# Patient Record
Sex: Female | Born: 1990 | Race: White | Hispanic: No | Marital: Single | Smoking: Never smoker
Health system: Southern US, Community
[De-identification: ages and names within clinical notes are randomized; demographics above are authoritative.]

## PROBLEM LIST (undated history)

## (undated) DIAGNOSIS — Q043 Other reduction deformities of brain: Secondary | ICD-10-CM

## (undated) DIAGNOSIS — G43909 Migraine, unspecified, not intractable, without status migrainosus: Secondary | ICD-10-CM

## (undated) DIAGNOSIS — R569 Unspecified convulsions: Secondary | ICD-10-CM

## (undated) HISTORY — PX: BREAST SURGERY: SHX581

## (undated) HISTORY — PX: FOOT SURGERY: SHX648

---
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
## 2008-03-21 ENCOUNTER — Other Ambulatory Visit: Admission: RE | Admit: 2008-03-21 | Discharge: 2008-03-21 | Payer: Self-pay | Admitting: Family Medicine

## 2009-10-03 ENCOUNTER — Other Ambulatory Visit: Admission: RE | Admit: 2009-10-03 | Discharge: 2009-10-03 | Payer: Self-pay | Admitting: Family Medicine

## 2009-11-10 ENCOUNTER — Emergency Department (HOSPITAL_BASED_OUTPATIENT_CLINIC_OR_DEPARTMENT_OTHER): Admission: EM | Admit: 2009-11-10 | Discharge: 2009-11-10 | Payer: Self-pay | Admitting: Emergency Medicine

## 2009-11-10 ENCOUNTER — Ambulatory Visit: Payer: Self-pay | Admitting: Diagnostic Radiology

## 2009-12-06 ENCOUNTER — Emergency Department (HOSPITAL_BASED_OUTPATIENT_CLINIC_OR_DEPARTMENT_OTHER): Admission: EM | Admit: 2009-12-06 | Discharge: 2009-12-06 | Payer: Self-pay | Admitting: Emergency Medicine

## 2010-09-06 ENCOUNTER — Emergency Department (HOSPITAL_BASED_OUTPATIENT_CLINIC_OR_DEPARTMENT_OTHER): Admission: EM | Admit: 2010-09-06 | Discharge: 2010-09-06 | Payer: Self-pay | Admitting: Emergency Medicine

## 2010-11-01 ENCOUNTER — Other Ambulatory Visit
Admission: RE | Admit: 2010-11-01 | Discharge: 2010-11-01 | Payer: Self-pay | Source: Home / Self Care | Admitting: Family Medicine

## 2010-11-29 ENCOUNTER — Emergency Department (HOSPITAL_BASED_OUTPATIENT_CLINIC_OR_DEPARTMENT_OTHER)
Admission: EM | Admit: 2010-11-29 | Discharge: 2010-11-30 | Disposition: A | Discharge status: Home / Self Care | Payer: Medicaid Other | Attending: Emergency Medicine | Admitting: Emergency Medicine

## 2010-11-29 DIAGNOSIS — R51 Headache: Secondary | ICD-10-CM | POA: Insufficient documentation

## 2011-04-01 ENCOUNTER — Emergency Department (HOSPITAL_BASED_OUTPATIENT_CLINIC_OR_DEPARTMENT_OTHER)
Admission: EM | Admit: 2011-04-01 | Discharge: 2011-04-01 | Disposition: A | Discharge status: Home / Self Care | Payer: Medicaid Other | Attending: Emergency Medicine | Admitting: Emergency Medicine

## 2011-04-01 DIAGNOSIS — G43909 Migraine, unspecified, not intractable, without status migrainosus: Secondary | ICD-10-CM | POA: Insufficient documentation

## 2011-04-01 DIAGNOSIS — Z79899 Other long term (current) drug therapy: Secondary | ICD-10-CM | POA: Insufficient documentation

## 2011-11-03 ENCOUNTER — Other Ambulatory Visit (HOSPITAL_COMMUNITY)
Admission: RE | Admit: 2011-11-03 | Discharge: 2011-11-03 | Disposition: A | Discharge status: Home / Self Care | Payer: Medicaid Other | Source: Ambulatory Visit | Attending: Family Medicine | Admitting: Family Medicine

## 2011-11-03 ENCOUNTER — Other Ambulatory Visit: Payer: Self-pay | Admitting: Family Medicine

## 2011-11-03 DIAGNOSIS — Z124 Encounter for screening for malignant neoplasm of cervix: Secondary | ICD-10-CM | POA: Insufficient documentation

## 2012-01-29 ENCOUNTER — Encounter (HOSPITAL_BASED_OUTPATIENT_CLINIC_OR_DEPARTMENT_OTHER): Payer: Self-pay | Admitting: *Deleted

## 2012-01-29 ENCOUNTER — Emergency Department (HOSPITAL_BASED_OUTPATIENT_CLINIC_OR_DEPARTMENT_OTHER)
Admission: EM | Admit: 2012-01-29 | Discharge: 2012-01-29 | Disposition: A | Discharge status: Home / Self Care | Payer: Medicaid Other | Attending: Emergency Medicine | Admitting: Emergency Medicine

## 2012-01-29 DIAGNOSIS — R51 Headache: Secondary | ICD-10-CM

## 2012-01-29 MED ORDER — METOCLOPRAMIDE HCL 5 MG/ML IJ SOLN
10.0000 mg | Freq: Once | INTRAMUSCULAR | Status: AC
  Administered 2012-01-29: 10 mg via INTRAVENOUS
  Filled 2012-01-29: qty 2

## 2012-01-29 MED ORDER — KETOROLAC TROMETHAMINE 30 MG/ML IJ SOLN
30.0000 mg | Freq: Once | INTRAMUSCULAR | Status: AC
  Administered 2012-01-29: 30 mg via INTRAVENOUS
  Filled 2012-01-29: qty 1

## 2012-01-29 MED ORDER — DEXAMETHASONE SODIUM PHOSPHATE 10 MG/ML IJ SOLN
10.0000 mg | Freq: Once | INTRAMUSCULAR | Status: AC
  Administered 2012-01-29: 10 mg via INTRAVENOUS
  Filled 2012-01-29: qty 1

## 2012-01-29 MED ORDER — DIPHENHYDRAMINE HCL 50 MG/ML IJ SOLN
25.0000 mg | Freq: Once | INTRAMUSCULAR | Status: AC
Start: 2012-01-29 — End: 2012-01-29
  Administered 2012-01-29: 25 mg via INTRAVENOUS
  Filled 2012-01-29: qty 1

## 2012-01-29 MED ORDER — METOCLOPRAMIDE HCL 10 MG PO TABS: 10.0000 mg | ORAL_TABLET | Freq: Four times a day (QID) | ORAL | Status: DC | PRN

## 2012-01-29 MED ORDER — SODIUM CHLORIDE 0.9 % IV BOLUS (SEPSIS)
1000.0000 mL | Freq: Once | INTRAVENOUS | Status: AC
  Administered 2012-01-29: 1000 mL via INTRAVENOUS

## 2012-01-29 NOTE — ED Notes (Signed)
Pt amb to room 3 with quick steady gait in nad, smiling and laughing. Pt reports ha 9/10 x 3 days. Pt states ha is 9/10. Denies any other c/o.

## 2012-01-29 NOTE — ED Provider Notes (Signed)
History     CSN: [id number]  Arrival date & time 01/29/12  1478   First MD Initiated Contact with Patient 01/29/12 [phone]      Chief Complaint  Patient presents with  . Headache    (Consider location/radiation/quality/duration/timing/severity/associated sxs/prior treatment) Patient is a 21 y.o. female presenting with headaches. The history is provided by the patient.  Headache   She has been having a sharp, severe headache which starts in the upper right frontotemporal area and radiates to the right TMJ area. Headache started 3 days ago. Nothing makes it better and nothing makes it worse. Pain is rated at 910. She has been having similar headaches for a long time and is being followed by a neurologist in [place]. She is treated with Maxalt, Benadryl, and Aleve and this lets her go to sleep but the headache is present when she wakes up in soon becomes just as severe. She denies photophobia and phonophobia. She denies nausea and vomiting. She denies visual disturbance. She has been told that when the headache gets severe, she should come to the emergency department. In the past, she has received a headache cocktail which has given her excellent relief.  No past medical history on file.  No past surgical history on file.  No family history on file.  History  Substance Use Topics  . Smoking status: Not on file  . Smokeless tobacco: Not on file  . Alcohol Use: Not on file    OB History    No data available      Review of Systems  Neurological: Positive for headaches.  All other systems reviewed and are negative.    Allergies  Review of patient's allergies indicates not on file.  Home Medications  No current outpatient prescriptions on file.  There were no vitals taken for this visit.  Physical Exam  Nursing note and vitals reviewed.  21 year old female who is resting comfortably and in no acute distress. Vital signs are normal. Oxygen saturation is 100% which is  normal. Head is normocephalic and atraumatic. PERRLA, EOMI. Fundi show no hemorrhage, exudate, or papilledema. There is some mild tenderness to palpation in the right frontotemporal area and moderate tenderness to palpation over the right TMJ. Pain is elicited by having her clench her teeth against resistance. Neck is nontender and supple without adenopathy. Back is nontender. Lungs are clear without rales, wheezes, rhonchi. Heart has regular rate and rhythm without murmur. Abdomen is soft, flat, nontender without masses or hepatosplenomegaly. Extremities have no cyanosis or edema, full range of motion is present. Skin is warm and dry without rash. Neurologic: Mental status is normal, cranial nerves are intact, there are no focal motor or sensory deficits.  ED Course  Procedures (including critical care time)  After IV fluids, IV metoclopramide, IV diphenhydramine, IV ketorolac, and IV dexamethasone, she is sleeping comfortably. She will be discharged with a prescription for metoclopramide tablets to see if adding that to her home headache cocktail gives her better relief of headaches at home.  1. Headache       MDM  Headache which has some characteristics of TMJ related headache. I suspect that her underlying neurologic condition triggers headaches which then are amplified and aggravated by TMJ. She will be given a headache cocktail and reassessed.        Dione Booze, MD 01/29/12 [phone]

## 2012-01-29 NOTE — Discharge Instructions (Signed)
Ask your dentist to evaluate you for possible TMJ syndrome. I am concerned that that might be part of what is causing your headaches. You may try taking Reglan at home along with Maxalt, Benadryl, and Aleve. It might give you better relief of her headaches.  Migraine Headache A migraine headache is an intense, throbbing pain on one or both sides of your head. The exact cause of a migraine headache is not always known. A migraine may be caused when nerves in the brain become irritated and release chemicals that cause swelling within blood vessels, causing pain. Many migraine sufferers have a family history of migraines. Before you get a migraine you may or may not get an aura. An aura is a group of symptoms that can predict the beginning of a migraine. An aura may include:  Visual changes such as:   Flashing lights.   Bright spots or zig-zag lines.   Tunnel vision.   Feelings of numbness.   Trouble talking.   Muscle weakness.  SYMPTOMS  Pain on one or both sides of your head.   Pain that is pulsating or throbbing in nature.   Pain that is severe enough to prevent daily activities.   Pain that is aggravated by any daily physical activity.   Nausea (feeling sick to your stomach), vomiting, or both.   Pain with exposure to bright lights, loud noises, or activity.   General sensitivity to bright lights or loud noises.  MIGRAINE TRIGGERS Examples of triggers of migraine headaches include:   Alcohol.   Smoking.   Stress.   It may be related to menses (female menstruation).   Aged cheeses.   Foods or drinks that contain nitrates, glutamate, aspartame, or tyramine.   Lack of sleep.   Chocolate.   Caffeine.   Hunger.   Medications such as nitroglycerine (used to treat chest pain), birth control pills, estrogen, and some blood pressure medications.  DIAGNOSIS  A migraine headache is often diagnosed based on:  Symptoms.   Physical examination.   A computerized  X-ray scan (computed tomography, CT) of your head.  TREATMENT  Medications can help prevent migraines if they are recurrent or should they become recurrent. Your caregiver can help you with a medication or treatment program that will be helpful to you.   Lying down in a dark, quiet room may be helpful.   Keeping a headache diary may help you find a trend as to what may be triggering your headaches.  SEEK IMMEDIATE MEDICAL CARE IF:   You have confusion, personality changes or seizures.   You have headaches that wake you from sleep.   You have an increased frequency in your headaches.   You have a stiff neck.   You have a loss of vision.   You have muscle weakness.   You start losing your balance or have trouble walking.   You feel faint or pass out.  MAKE SURE YOU:   Understand these instructions.   Will watch your condition.   Will get help right away if you are not doing well or get worse.  Document Released: 10/13/2005 Document Revised: 10/02/2011 Document Reviewed: 05/29/2009 [facility] Patient Information 2012 [place], [place].  Metoclopramide tablets What is this medicine? METOCLOPRAMIDE (met oh kloe PRA mide) is used to treat the symptoms of gastroesophageal reflux disease (GERD) like heartburn. It is also used to treat people with slow emptying of the stomach and intestinal tract. This medicine may be used for other purposes; ask your health  care provider or pharmacist if you have questions. What should I tell my health care provider before I take this medicine? They need to know if you have any of these conditions: -breast cancer -depression -diabetes -heart failure -high blood pressure -kidney disease -liver disease -Parkinson's disease or a movement disorder -pheochromocytoma -seizures -stomach obstruction, bleeding, or perforation -an unusual or allergic reaction to metoclopramide, procainamide, sulfites, other medicines, foods, dyes, or  preservatives -pregnant or trying to get pregnant -breast-feeding How should I use this medicine? Take this medicine by mouth with a glass of water. Follow the directions on the prescription label. Take this medicine on an empty stomach, about 30 minutes before eating. Take your doses at regular intervals. Do not take your medicine more often than directed. Do not stop taking except on the advice of your doctor or health care professional. A special MedGuide will be given to you by the pharmacist with each prescription and refill. Be sure to read this information carefully each time. Talk to your pediatrician regarding the use of this medicine in children. Special care may be needed. Overdosage: If you think you have taken too much of this medicine contact a poison control center or emergency room at once. NOTE: This medicine is only for you. Do not share this medicine with others. What if I miss a dose? If you miss a dose, take it as soon as you can. If it is almost time for your next dose, take only that dose. Do not take double or extra doses. What may interact with this medicine? -acetaminophen -cyclosporine -digoxin -medicines for blood pressure -medicines for diabetes, including insulin -medicines for hay fever and other allergies -medicines for depression, especially an Monoamine Oxidase Inhibitor (MAOI) -medicines for Parkinson's disease, like levodopa -medicines for sleep or for pain -tetracycline This list may not describe all possible interactions. Give your health care provider a list of all the medicines, herbs, non-prescription drugs, or dietary supplements you use. Also tell them if you smoke, drink alcohol, or use illegal drugs. Some items may interact with your medicine. What should I watch for while using this medicine? It may take a few weeks for your stomach condition to start to get better. However, do not take this medicine for longer than 12 weeks. The longer you take  this medicine, and the more you take it, the greater your chances are of developing serious side effects. If you are an elderly patient, a female patient, or you have diabetes, you may be at an increased risk for side effects from this medicine. Contact your doctor immediately if you start having movements you cannot control such as lip smacking, rapid movements of the tongue, involuntary or uncontrollable movements of the eyes, head, arms and legs, or muscle twitches and spasms. Patients and their families should watch out for worsening depression or thoughts of suicide. Also watch out for any sudden or severe changes in feelings such as feeling anxious, agitated, panicky, irritable, hostile, aggressive, impulsive, severely restless, overly excited and hyperactive, or not being able to sleep. If this happens, especially at the beginning of treatment or after a change in dose, call your doctor. Do not treat yourself for high fever. Ask your doctor or health care professional for advice. You may get drowsy or dizzy. Do not drive, use machinery, or do anything that needs mental alertness until you know how this drug affects you. Do not stand or sit up quickly, especially if you are an older patient. This reduces the  risk of dizzy or fainting spells. Alcohol can make you more drowsy and dizzy. Avoid alcoholic drinks. What side effects may I notice from receiving this medicine? Side effects that you should report to your doctor or health care professional as soon as possible: -allergic reactions like skin rash, itching or hives, swelling of the face, lips, or tongue -abnormal production of milk in females -breast enlargement in both males and females -change in the way you walk -difficulty moving, speaking or swallowing -drooling, lip smacking, or rapid movements of the tongue -excessive sweating -fever -involuntary or uncontrollable movements of the eyes, head, arms and legs -irregular heartbeat or  palpitations -muscle twitches and spasms -unusually weak or tired Side effects that usually do not require medical attention (report to your doctor or health care professional if they continue or are bothersome): -change in sex drive or performance -depressed mood -diarrhea -difficulty sleeping -headache -menstrual changes -restless or nervous This list may not describe all possible side effects. Call your doctor for medical advice about side effects. You may report side effects to FDA at 1-800-FDA-1088. Where should I keep my medicine? Keep out of the reach of children. Store at room temperature between 20 and 25 degrees C (68 and 77 degrees F). Protect from light. Keep container tightly closed. Throw away any unused medicine after the expiration date. NOTE: This sheet is a summary. It may not cover all possible information. If you have questions about this medicine, talk to your doctor, pharmacist, or health care provider.  2012, [employer]/Gold Standard. (06/07/2008 4:30:05 PM)  Temporomandibular Problems  Temporomandibular joint (TMJ) dysfunction means there are problems with the joint between your jaw and your skull. This is a joint lined by cartilage like other joints in your body but also has a small disc in the joint which keeps the bones from rubbing on each other. These joints are like other joints and can get inflamed (sore) from arthritis and other problems. When this joint gets sore, it can cause headaches and pain in the jaw and the face. CAUSES  Usually the arthritic types of problems are caused by soreness in the joint. Soreness in the joint can also be caused by overuse. This may come from grinding your teeth. It may also come from mis-alignment in the joint. DIAGNOSIS Diagnosis of this condition can often be made by history and exam. Sometimes your caregiver may need X-rays or an MRI scan to determine the exact cause. It may be necessary to see your dentist to determine if your  teeth and jaws are lined up correctly. TREATMENT  Most of the time this problem is not serious; however, sometimes it can persist (become chronic). When this happens medications that will cut down on inflammation (soreness) help. Sometimes a shot of cortisone into the joint will be helpful. If your teeth are not aligned it may help for your dentist to make a splint for your mouth that can help this problem. If no physical problems can be found, the problem may come from tension. If tension is found to be the cause, biofeedback or relaxation techniques may be helpful. HOME CARE INSTRUCTIONS   Later in the day, applications of ice packs may be helpful. Ice can be used in a plastic bag with a towel around it to prevent frostbite to skin. This may be used about every 2 hours for 20 to 30 minutes, as needed while awake, or as directed by your caregiver.   Only take over-the-counter or prescription medicines for pain,  discomfort, or fever as directed by your caregiver.   If physical therapy was prescribed, follow your caregiver's directions.   Wear mouth appliances as directed if they were given.  Document Released: 07/08/2001 Document Revised: 10/02/2011 Document Reviewed: 10/15/2008 [facility] Patient Information 2012 [place], [place].

## 2012-04-14 ENCOUNTER — Telehealth: Payer: Self-pay | Admitting: *Deleted

## 2012-04-15 NOTE — Telephone Encounter (Signed)
Encounter opened in error

## 2012-05-10 ENCOUNTER — Encounter (HOSPITAL_BASED_OUTPATIENT_CLINIC_OR_DEPARTMENT_OTHER): Payer: Self-pay | Admitting: Family Medicine

## 2012-05-10 ENCOUNTER — Emergency Department (HOSPITAL_BASED_OUTPATIENT_CLINIC_OR_DEPARTMENT_OTHER)
Admission: EM | Admit: 2012-05-10 | Discharge: 2012-05-10 | Disposition: A | Discharge status: Home / Self Care | Payer: Medicaid Other | Attending: Emergency Medicine | Admitting: Emergency Medicine

## 2012-05-10 DIAGNOSIS — R51 Headache: Secondary | ICD-10-CM

## 2012-05-10 DIAGNOSIS — Z79899 Other long term (current) drug therapy: Secondary | ICD-10-CM | POA: Insufficient documentation

## 2012-05-10 DIAGNOSIS — R112 Nausea with vomiting, unspecified: Secondary | ICD-10-CM | POA: Insufficient documentation

## 2012-05-10 HISTORY — DX: Migraine, unspecified, not intractable, without status migrainosus: G43.909

## 2012-05-10 HISTORY — DX: Unspecified convulsions: R56.9

## 2012-05-10 MED ORDER — ONDANSETRON 4 MG PO TBDP: 4.0000 mg | ORAL_TABLET | Freq: Three times a day (TID) | ORAL | Status: AC | PRN

## 2012-05-10 MED ORDER — KETOROLAC TROMETHAMINE 30 MG/ML IJ SOLN
30.0000 mg | Freq: Once | INTRAMUSCULAR | Status: AC
  Administered 2012-05-10: 30 mg via INTRAVENOUS
  Filled 2012-05-10: qty 1

## 2012-05-10 MED ORDER — DIPHENHYDRAMINE HCL 50 MG/ML IJ SOLN
25.0000 mg | Freq: Once | INTRAMUSCULAR | Status: AC
  Administered 2012-05-10: 25 mg via INTRAVENOUS
  Filled 2012-05-10: qty 1

## 2012-05-10 MED ORDER — SODIUM CHLORIDE 0.9 % IV BOLUS (SEPSIS)
1000.0000 mL | Freq: Once | INTRAVENOUS | Status: AC
  Administered 2012-05-10: 1000 mL via INTRAVENOUS

## 2012-05-10 MED ORDER — DEXAMETHASONE SODIUM PHOSPHATE 10 MG/ML IJ SOLN
10.0000 mg | Freq: Once | INTRAMUSCULAR | Status: AC
  Administered 2012-05-10: 10 mg via INTRAVENOUS
  Filled 2012-05-10: qty 1

## 2012-05-10 NOTE — ED Provider Notes (Signed)
History/physical exam/procedure(s) were performed by non-physician practitioner and as supervising physician I was immediately available for consultation/collaboration. I have reviewed all notes and am in agreement with care and plan.   Denisia Harpole S Nao Linz, MD 05/10/12 1434 

## 2012-05-10 NOTE — ED Provider Notes (Signed)
History     CSN: [id number]  Arrival date & time 05/10/12  1059   First MD Initiated Contact with Patient 05/10/12 1206      Chief Complaint  Patient presents with  . Migraine    (Consider location/radiation/quality/duration/timing/severity/associated sxs/prior treatment) HPI Comments: Pt states that she has a history of similar symptoms and sometimes need inpt treatment  Patient is a 21 y.o. female presenting with headaches. The history is provided by the patient. No language interpreter was used.  Headache  This is a recurrent problem. The current episode started yesterday. The problem occurs constantly. The problem has not changed since onset.The headache is associated with nothing. The pain is located in the right unilateral region. The quality of the pain is described as throbbing. The pain is moderate. The pain does not radiate. Associated symptoms include nausea and vomiting. Pertinent negatives include no fever and no shortness of breath. Treatments tried: excedtrin. The treatment provided mild relief.    Past Medical History  Diagnosis Date  . Seizures   . Migraine     Past Surgical History  Procedure Date  . Foot surgery   . Breast surgery     No family history on file.  History  Substance Use Topics  . Smoking status: Never Smoker   . Smokeless tobacco: Not on file  . Alcohol Use: Yes     occasionally    OB History    Grav Para Term Preterm Abortions TAB SAB Ect Mult Living                  Review of Systems  Constitutional: Negative for fever.  Eyes: Negative.   Respiratory: Negative for shortness of breath.   Cardiovascular: Negative.   Gastrointestinal: Positive for nausea and vomiting.  Neurological: Positive for headaches.    Allergies  Codeine and Oxycontin  Home Medications   Current Outpatient Rx  Name Route Sig Dispense Refill  . PROMETHAZINE HCL 25 MG PO TABS Oral Take 25 mg by mouth every 6 (six) hours as needed.    Marland Kitchen  LAMOTRIGINE 200 MG PO TABS Oral Take 300 mg by mouth daily.    Marland Kitchen METOCLOPRAMIDE HCL 10 MG PO TABS Oral Take 1 tablet (10 mg total) by mouth every 6 (six) hours as needed (nausea or headache). 30 tablet 0  . NORETHIN ACE-ETH ESTRAD-FE 1.5-30 MG-MCG PO TABS Oral Take 1 tablet by mouth daily.    . TOPIRAMATE 50 MG PO TABS Oral Take 50 mg by mouth 2 (two) times daily.      BP 110/85  Pulse 76  Temp 98.2 F (36.8 C) (Oral)  Resp 20  Ht 5\' 2"  (1.575 m)  Wt 107 lb (48.535 kg)  BMI 19.57 kg/m2  SpO2 97%  LMP 05/09/2012  Physical Exam  Nursing note and vitals reviewed. Constitutional: She is oriented to person, place, and time. She appears well-developed and well-nourished.  HENT:  Head: Normocephalic and atraumatic.  Eyes: Conjunctivae and EOM are normal. Pupils are equal, round, and reactive to light.  Neck: Normal range of motion. Neck supple.  Cardiovascular: Normal rate and regular rhythm.   Pulmonary/Chest: Effort normal and breath sounds normal.  Musculoskeletal: Normal range of motion.  Neurological: She is alert and oriented to person, place, and time.  Skin: Skin is warm and dry.    ED Course  Procedures (including critical care time)  Labs Reviewed - No data to display No results found.   1. Headache  MDM  Pt is feeling better at this time:pt has history of similar symptoms in the past        Teressa Lower, NP 05/10/12 1334

## 2012-05-10 NOTE — ED Notes (Signed)
Pt c/o right sided headache since last night with nausea and 1 episode of vomiting this morning. Pt reports h/o same. Pt sts she took Excedrin last night.

## 2012-09-23 ENCOUNTER — Emergency Department (HOSPITAL_BASED_OUTPATIENT_CLINIC_OR_DEPARTMENT_OTHER)
Admission: EM | Admit: 2012-09-23 | Discharge: 2012-09-23 | Disposition: A | Discharge status: Home / Self Care | Payer: Medicaid Other | Attending: Emergency Medicine | Admitting: Emergency Medicine

## 2012-09-23 ENCOUNTER — Encounter (HOSPITAL_BASED_OUTPATIENT_CLINIC_OR_DEPARTMENT_OTHER): Payer: Self-pay | Admitting: *Deleted

## 2012-09-23 DIAGNOSIS — H53149 Visual discomfort, unspecified: Secondary | ICD-10-CM | POA: Insufficient documentation

## 2012-09-23 DIAGNOSIS — G43909 Migraine, unspecified, not intractable, without status migrainosus: Secondary | ICD-10-CM

## 2012-09-23 DIAGNOSIS — G40909 Epilepsy, unspecified, not intractable, without status epilepticus: Secondary | ICD-10-CM | POA: Insufficient documentation

## 2012-09-23 DIAGNOSIS — Z79899 Other long term (current) drug therapy: Secondary | ICD-10-CM | POA: Insufficient documentation

## 2012-09-23 DIAGNOSIS — R110 Nausea: Secondary | ICD-10-CM | POA: Insufficient documentation

## 2012-09-23 LAB — URINALYSIS, ROUTINE W REFLEX MICROSCOPIC
Leukocytes, UA: NEGATIVE
Protein, ur: NEGATIVE mg/dL
Urobilinogen, UA: 1 mg/dL (ref 0.0–1.0)

## 2012-09-23 LAB — PREGNANCY, URINE: Preg Test, Ur: NEGATIVE

## 2012-09-23 MED ORDER — SODIUM CHLORIDE 0.9 % IV SOLN
INTRAVENOUS | Status: DC
  Administered 2012-09-23: 18:00:00 via INTRAVENOUS

## 2012-09-23 MED ORDER — METOCLOPRAMIDE HCL 5 MG/ML IJ SOLN
10.0000 mg | Freq: Once | INTRAMUSCULAR | Status: AC
  Administered 2012-09-23: 10 mg via INTRAVENOUS
  Filled 2012-09-23: qty 2

## 2012-09-23 MED ORDER — KETOROLAC TROMETHAMINE 30 MG/ML IJ SOLN
30.0000 mg | Freq: Once | INTRAMUSCULAR | Status: AC
  Administered 2012-09-23: 30 mg via INTRAVENOUS
  Filled 2012-09-23: qty 1

## 2012-09-23 MED ORDER — DIPHENHYDRAMINE HCL 50 MG/ML IJ SOLN
25.0000 mg | Freq: Once | INTRAMUSCULAR | Status: AC
  Administered 2012-09-23: 25 mg via INTRAVENOUS
  Filled 2012-09-23: qty 1

## 2012-09-23 NOTE — ED Notes (Signed)
Pt. Reports headache started 3 days ago and nothing has helped the headache.  Pt. Is in no distress with no neuro deficits.  Pt. Reports she has her menstrual at this time.

## 2012-09-23 NOTE — ED Provider Notes (Signed)
History     CSN: [id number]  Arrival date & time 09/23/12  1635   First MD Initiated Contact with Patient 09/23/12 1651      Chief Complaint  Patient presents with  . Migraine    (Consider location/radiation/quality/duration/timing/severity/associated sxs/prior treatment) HPI Comments: This is a 21 year old female, who presents emergency department with chief complaint of migraine headache x3 days. Patient has a history of migraines. She states this feels like her typical migraine. It is associated with photophobia and nausea. She takes Topamax for her migraines, but has not had any relief. Her pain is 10/10.  She does not have any focal neurologic deficits.  Her migraine symptoms have been constant.  The history is provided by the patient. No language interpreter was used.    Past Medical History  Diagnosis Date  . Migraine   . Seizures     last seizure 2 years ago    Past Surgical History  Procedure Date  . Foot surgery   . Breast surgery     No family history on file.  History  Substance Use Topics  . Smoking status: Never Smoker   . Smokeless tobacco: Never Used  . Alcohol Use: Yes     Comment: occasionally    OB History    Grav Para Term Preterm Abortions TAB SAB Ect Mult Living                  Review of Systems  All other systems reviewed and are negative.    Allergies  Codeine; Oxycontin; and Rizatriptan  Home Medications   Current Outpatient Rx  Name  Route  Sig  Dispense  Refill  . LAMOTRIGINE 200 MG PO TABS   Oral   Take 300 mg by mouth daily.         Azzie Roup ACE-ETH ESTRAD-FE 1.5-30 MG-MCG PO TABS   Oral   Take 1 tablet by mouth daily.         Marland Kitchen PROMETHAZINE HCL 25 MG PO TABS   Oral   Take 25 mg by mouth every 6 (six) hours as needed.         . TOPIRAMATE 50 MG PO TABS   Oral   Take 50 mg by mouth 2 (two) times daily. 75mg  at night         . METOCLOPRAMIDE HCL 10 MG PO TABS   Oral   Take 1 tablet (10 mg total) by  mouth every 6 (six) hours as needed (nausea or headache).   30 tablet   0     BP 126/84  Pulse 86  Temp 98.5 F (36.9 C) (Oral)  Resp 18  SpO2 100%  LMP 09/19/2012  Physical Exam  Nursing note and vitals reviewed. Constitutional: She is oriented to person, place, and time. She appears well-developed and well-nourished.  HENT:  Head: Normocephalic and atraumatic.  Right Ear: External ear normal.  Left Ear: External ear normal.       Non-tender over temporal artery, no increased pain with chewing.  Eyes: Conjunctivae normal and EOM are normal. Pupils are equal, round, and reactive to light.       No papilledema  Neck: Normal range of motion. Neck supple.       No pain with neck flexion, no meningismus  Cardiovascular: Normal rate, regular rhythm and normal heart sounds.  Exam reveals no gallop and no friction rub.   No murmur heard. Pulmonary/Chest: Effort normal and breath sounds normal. No respiratory distress. She  has no wheezes. She has no rales. She exhibits no tenderness.  Abdominal: Soft. Bowel sounds are normal. She exhibits no distension and no mass. There is no tenderness. There is no rebound and no guarding.  Musculoskeletal: Normal range of motion. She exhibits no edema and no tenderness.       Normal gait.  Neurological: She is alert and oriented to person, place, and time. She has normal reflexes.       CN 3-12 intact, no pronator drift, normal shin to heel, normal RAM, sensation and strength intact bilaterally.  Skin: Skin is warm and dry.  Psychiatric: She has a normal mood and affect. Her behavior is normal. Judgment and thought content normal.    ED Course  Procedures (including critical care time)  Labs Reviewed  URINALYSIS, ROUTINE W REFLEX MICROSCOPIC - Abnormal; Notable for the following:    APPearance CLOUDY (*)     Hgb urine dipstick LARGE (*)     All other components within normal limits  URINE MICROSCOPIC-ADD ON - Abnormal; Notable for the  following:    Squamous Epithelial / LPF FEW (*)     All other components within normal limits  PREGNANCY, URINE   No results found.   1. Migraine       MDM  21 year old with typical migraine. Given migraine cocktail in the ED. Patient reports that she is feeling much better, and his rate to go home. I'm going to discharge the patient to home. She is being followed by neurologist, so I will have her followup with neurology. Patient is agreeable with this plan. She is stable and ready for discharge.        Roxy Horseman, PA-C 09/23/12 1911

## 2012-09-23 NOTE — ED Notes (Signed)
Unable to obtain d/c signature. The computer in pt's room locked while attempting to obtain signature and system would not allow me to obtain her signature on a different work station. Pt advised no questions during d/c teaching.

## 2012-09-23 NOTE — ED Notes (Signed)
Family at bedside. 

## 2012-09-23 NOTE — ED Notes (Signed)
Pt has hx of mha- c/o headache x 3 days- nausea

## 2012-09-24 NOTE — ED Provider Notes (Signed)
Medical screening examination/treatment/procedure(s) were performed by non-physician practitioner and as supervising physician I was immediately available for consultation/collaboration.    Celene Kras, MD 09/24/12 [phone]

## 2012-11-05 ENCOUNTER — Other Ambulatory Visit (HOSPITAL_COMMUNITY)
Admission: RE | Admit: 2012-11-05 | Discharge: 2012-11-05 | Disposition: A | Discharge status: Home / Self Care | Payer: Medicaid Other | Source: Ambulatory Visit | Attending: Family Medicine | Admitting: Family Medicine

## 2012-11-05 ENCOUNTER — Other Ambulatory Visit: Payer: Self-pay | Admitting: Family Medicine

## 2012-11-05 DIAGNOSIS — Z124 Encounter for screening for malignant neoplasm of cervix: Secondary | ICD-10-CM | POA: Insufficient documentation

## 2012-11-27 ENCOUNTER — Emergency Department (HOSPITAL_BASED_OUTPATIENT_CLINIC_OR_DEPARTMENT_OTHER)
Admission: EM | Admit: 2012-11-27 | Discharge: 2012-11-27 | Disposition: A | Discharge status: Home / Self Care | Payer: Medicaid Other | Attending: Emergency Medicine | Admitting: Emergency Medicine

## 2012-11-27 ENCOUNTER — Encounter (HOSPITAL_BASED_OUTPATIENT_CLINIC_OR_DEPARTMENT_OTHER): Payer: Self-pay | Admitting: *Deleted

## 2012-11-27 DIAGNOSIS — G40909 Epilepsy, unspecified, not intractable, without status epilepticus: Secondary | ICD-10-CM | POA: Insufficient documentation

## 2012-11-27 DIAGNOSIS — Z79899 Other long term (current) drug therapy: Secondary | ICD-10-CM | POA: Insufficient documentation

## 2012-11-27 DIAGNOSIS — R5383 Other fatigue: Secondary | ICD-10-CM | POA: Insufficient documentation

## 2012-11-27 DIAGNOSIS — G43909 Migraine, unspecified, not intractable, without status migrainosus: Secondary | ICD-10-CM | POA: Insufficient documentation

## 2012-11-27 DIAGNOSIS — R5381 Other malaise: Secondary | ICD-10-CM | POA: Insufficient documentation

## 2012-11-27 DIAGNOSIS — Z3202 Encounter for pregnancy test, result negative: Secondary | ICD-10-CM | POA: Insufficient documentation

## 2012-11-27 LAB — PREGNANCY, URINE: Preg Test, Ur: NEGATIVE

## 2012-11-27 MED ORDER — DEXAMETHASONE SODIUM PHOSPHATE 10 MG/ML IJ SOLN
10.0000 mg | Freq: Once | INTRAMUSCULAR | Status: AC
  Administered 2012-11-27: 10 mg via INTRAVENOUS
  Filled 2012-11-27: qty 1

## 2012-11-27 MED ORDER — SODIUM CHLORIDE 0.9 % IV BOLUS (SEPSIS)
1000.0000 mL | Freq: Once | INTRAVENOUS | Status: AC
  Administered 2012-11-27: 1000 mL via INTRAVENOUS

## 2012-11-27 MED ORDER — DEXTROSE 5 % IV BOLUS: 1000.0000 mL | Freq: Once | INTRAVENOUS | Status: DC

## 2012-11-27 MED ORDER — KETOROLAC TROMETHAMINE 30 MG/ML IJ SOLN
30.0000 mg | Freq: Once | INTRAMUSCULAR | Status: AC
  Administered 2012-11-27: 30 mg via INTRAVENOUS
  Filled 2012-11-27: qty 1

## 2012-11-27 MED ORDER — METOCLOPRAMIDE HCL 5 MG/ML IJ SOLN
10.0000 mg | Freq: Once | INTRAMUSCULAR | Status: AC
  Administered 2012-11-27: 10 mg via INTRAVENOUS
  Filled 2012-11-27: qty 2

## 2012-11-27 MED ORDER — DIPHENHYDRAMINE HCL 50 MG/ML IJ SOLN
12.5000 mg | Freq: Once | INTRAMUSCULAR | Status: AC
  Administered 2012-11-27: 12.5 mg via INTRAVENOUS
  Filled 2012-11-27: qty 1

## 2012-11-27 NOTE — ED Provider Notes (Signed)
History     CSN: [id number]  Arrival date & time 11/27/12  1844   First MD Initiated Contact with Patient 11/27/12 1853      Chief Complaint  Patient presents with  . Headache    (Consider location/radiation/quality/duration/timing/severity/associated sxs/prior treatment) HPI Ann Hood is a 22 y.o. female who presents to ED with a headache. States headache started 3 days ago. States has hx of migraines, followed by neurologist. Usually able to take ibuprofen, but sometimes they get really severe and has to come to ER for IV treatments.  Pt states took ibuprofen with no relief. States nausea, no vomiting. Feels like her prior migraines. Pain to the right back of the head radiating into right parietal area. No visual changes. No numbness or weakness of extremities. No photo sensetivity. No injury.   Past Medical History  Diagnosis Date  . Migraine   . Seizures     last seizure 2 years ago    Past Surgical History  Procedure Date  . Foot surgery   . Breast surgery     History reviewed. No pertinent family history.  History  Substance Use Topics  . Smoking status: Never Smoker   . Smokeless tobacco: Never Used  . Alcohol Use: Yes     Comment: occasionally    OB History    Grav Para Term Preterm Abortions TAB SAB Ect Mult Living                  Review of Systems  Constitutional: Positive for fatigue. Negative for fever and chills.  HENT: Negative for congestion, sore throat, neck pain and neck stiffness.   Eyes: Negative for photophobia, pain and visual disturbance.  Neurological: Positive for headaches. Negative for dizziness, weakness, light-headedness and numbness.    Allergies  Codeine; Oxycontin; and Rizatriptan  Home Medications   Current Outpatient Rx  Name  Route  Sig  Dispense  Refill  . LAMOTRIGINE 200 MG PO TABS   Oral   Take 300 mg by mouth daily.         Marland Kitchen METOCLOPRAMIDE HCL 10 MG PO TABS   Oral   Take 1 tablet (10 mg total) by  mouth every 6 (six) hours as needed (nausea or headache).   30 tablet   0   . NORETHIN ACE-ETH ESTRAD-FE 1.5-30 MG-MCG PO TABS   Oral   Take 1 tablet by mouth daily.         Marland Kitchen PROMETHAZINE HCL 25 MG PO TABS   Oral   Take 25 mg by mouth every 6 (six) hours as needed.         . TOPIRAMATE 50 MG PO TABS   Oral   Take 50 mg by mouth 2 (two) times daily. 75mg  at night           BP 119/68  Pulse 76  Temp 98.3 F (36.8 C) (Oral)  Resp 18  Ht 5\' 2"  (1.575 m)  Wt 108 lb (48.988 kg)  BMI 19.75 kg/m2  SpO2 100%  LMP 08/27/2012  Physical Exam  Nursing note and vitals reviewed. Constitutional: She is oriented to person, place, and time. She appears well-developed and well-nourished. No distress.  HENT:  Head: Normocephalic.  Eyes: Conjunctivae normal are normal.  Neck: Neck supple.  Cardiovascular: Normal rate, regular rhythm and normal heart sounds.   Pulmonary/Chest: Effort normal and breath sounds normal. No respiratory distress. She has no wheezes. She has no rales.  Musculoskeletal: She exhibits no edema.  Neurological: She is alert and oriented to person, place, and time.       5/5 and equal upper and lower extremity strength bilaterally. Equal grip strength bilaterally. Normal finger to nose. No pronator drift.   Skin: Skin is warm and dry.    ED Course  Procedures (including critical care time)   Labs Reviewed  PREGNANCY, URINE   No results found.   1. Migraine headache       MDM  Pt with typical migraine. No neuro deficits. No fever, neck pain or stiffness, no meningismus. She was given toradol, reglan, benadryl, decadron IV. Fluid bolus. She is feeling better. Pain down to 2/10. She states she is feeling hungry and ready to go home. D/c home with mother.    Filed Vitals:   11/27/12 2042  BP: 104/56  Pulse: 71  Temp:   Resp: 20       Balinda Heacock A Diamante Truszkowski, PA 11/27/12 2313

## 2012-11-27 NOTE — ED Notes (Signed)
Pt d/c home with ride- no new rx given

## 2012-11-27 NOTE — ED Notes (Signed)
Headache x 3 days. Taking Ibuprofen. Also c/o nausea.

## 2012-11-28 NOTE — ED Provider Notes (Signed)
Medical screening examination/treatment/procedure(s) were performed by non-physician practitioner and as supervising physician I was immediately available for consultation/collaboration.   Gilda Crease, MD 11/28/12 1550

## 2012-12-05 ENCOUNTER — Encounter (HOSPITAL_BASED_OUTPATIENT_CLINIC_OR_DEPARTMENT_OTHER): Payer: Self-pay

## 2012-12-05 ENCOUNTER — Emergency Department (HOSPITAL_BASED_OUTPATIENT_CLINIC_OR_DEPARTMENT_OTHER)
Admission: EM | Admit: 2012-12-05 | Discharge: 2012-12-05 | Disposition: A | Discharge status: Home / Self Care | Payer: Medicaid Other | Attending: Emergency Medicine | Admitting: Emergency Medicine

## 2012-12-05 DIAGNOSIS — G40909 Epilepsy, unspecified, not intractable, without status epilepticus: Secondary | ICD-10-CM | POA: Insufficient documentation

## 2012-12-05 DIAGNOSIS — R0789 Other chest pain: Secondary | ICD-10-CM | POA: Insufficient documentation

## 2012-12-05 DIAGNOSIS — G43909 Migraine, unspecified, not intractable, without status migrainosus: Secondary | ICD-10-CM

## 2012-12-05 DIAGNOSIS — Z79899 Other long term (current) drug therapy: Secondary | ICD-10-CM | POA: Insufficient documentation

## 2012-12-05 DIAGNOSIS — J3489 Other specified disorders of nose and nasal sinuses: Secondary | ICD-10-CM | POA: Insufficient documentation

## 2012-12-05 DIAGNOSIS — R110 Nausea: Secondary | ICD-10-CM | POA: Insufficient documentation

## 2012-12-05 HISTORY — DX: Other reduction deformities of brain: Q04.3

## 2012-12-05 MED ORDER — METOCLOPRAMIDE HCL 5 MG/ML IJ SOLN
10.0000 mg | Freq: Once | INTRAMUSCULAR | Status: AC
  Administered 2012-12-05: 10 mg via INTRAMUSCULAR
  Filled 2012-12-05: qty 2

## 2012-12-05 MED ORDER — KETOROLAC TROMETHAMINE 30 MG/ML IJ SOLN
30.0000 mg | Freq: Once | INTRAMUSCULAR | Status: AC
  Administered 2012-12-05: 30 mg via INTRAVENOUS
  Filled 2012-12-05: qty 1

## 2012-12-05 MED ORDER — SODIUM CHLORIDE 0.9 % IV BOLUS (SEPSIS)
1000.0000 mL | Freq: Once | INTRAVENOUS | Status: AC
  Administered 2012-12-05: 1000 mL via INTRAVENOUS

## 2012-12-05 MED ORDER — DIPHENHYDRAMINE HCL 50 MG/ML IJ SOLN
25.0000 mg | Freq: Once | INTRAMUSCULAR | Status: AC
  Administered 2012-12-05: 25 mg via INTRAVENOUS
  Filled 2012-12-05: qty 1

## 2012-12-05 MED ORDER — DEXAMETHASONE SODIUM PHOSPHATE 10 MG/ML IJ SOLN
10.0000 mg | Freq: Once | INTRAMUSCULAR | Status: AC
  Administered 2012-12-05: 10 mg via INTRAVENOUS
  Filled 2012-12-05: qty 1

## 2012-12-05 NOTE — ED Notes (Signed)
MD at bedside. 

## 2012-12-05 NOTE — ED Provider Notes (Signed)
History     CSN: [id number]  Arrival date & time 12/05/12  0142   First MD Initiated Contact with Patient 12/05/12 0155      Chief Complaint  Patient presents with  . Headache    (Consider location/radiation/quality/duration/timing/severity/associated sxs/prior treatment) HPI 22 year old with a history of migraines. She is here with a headache that began 2 days ago. It began in the septal region and is now moved to the forehead bilaterally. It is similar to prior migraines except that it is bilateral or as she typically has right-sided pain. It is associated with nausea but no vomiting. She denies photophobia. She has had nasal drainage as well as chest discomfort and difficulty taking a deep breath. Her pain is moderate to severe. It has not responded to over-the-counter analgesics. There is no associated focal neurologic deficit.  Past Medical History  Diagnosis Date  . Migraine   . Seizures     last seizure 2 years ago    Past Surgical History  Procedure Laterality Date  . Foot surgery    . Breast surgery      No family history on file.  History  Substance Use Topics  . Smoking status: Never Smoker   . Smokeless tobacco: Never Used  . Alcohol Use: Yes     Comment: occasionally    OB History   Grav Para Term Preterm Abortions TAB SAB Ect Mult Living                  Review of Systems  All other systems reviewed and are negative.    Allergies  Codeine; Oxycontin; and Rizatriptan  Home Medications   Current Outpatient Rx  Name  Route  Sig  Dispense  Refill  . lamoTRIgine (LAMICTAL) 200 MG tablet   Oral   Take 300 mg by mouth daily.         . norethindrone-ethinyl estradiol-iron (MICROGESTIN FE,GILDESS FE,LOESTRIN FE) 1.5-30 MG-MCG tablet   Oral   Take 1 tablet by mouth daily.         . promethazine (PHENERGAN) 25 MG tablet   Oral   Take 25 mg by mouth every 6 (six) hours as needed.         . topiramate (TOPAMAX) 50 MG tablet   Oral   Take  50 mg by mouth 2 (two) times daily. 75mg  at night           BP 97/66  Pulse 67  Temp(Src) 98 F (36.7 C) (Oral)  Resp 18  SpO2 100%  LMP 11/30/2012  Physical Exam General: Well-developed, well-nourished female in no acute distress; appearance consistent with age of record HENT: normocephalic, atraumatic Eyes: pupils equal round and reactive to light; extraocular muscles intact Neck: supple Heart: regular rate and rhythm Lungs: clear to auscultation bilaterally Abdomen: soft; nondistended; nontender; no masses or hepatosplenomegaly; bowel sounds present Extremities: No deformity; full range of motion; pulses normal; no edema Neurologic: Awake, alert and oriented; motor function intact in all extremities and symmetric; no facial droop Skin: Warm and dry Psychiatric: Normal mood and affect    ED Course  Procedures (including critical care time)     MDM  3:43 AM Patient states her pain is now down to a 3/10 and she is comfortable going home.        Hanley Seamen, MD 12/05/12 [phone]

## 2012-12-05 NOTE — ED Notes (Signed)
D/c home with parent to drive- ambulatory with need for assist

## 2012-12-05 NOTE — ED Notes (Signed)
Patient reports that she has had generalized headache x 2 days with nausea. Also reports clear nasal drainage as well

## 2012-12-19 ENCOUNTER — Encounter (HOSPITAL_BASED_OUTPATIENT_CLINIC_OR_DEPARTMENT_OTHER): Payer: Self-pay | Admitting: *Deleted

## 2012-12-19 ENCOUNTER — Emergency Department (HOSPITAL_BASED_OUTPATIENT_CLINIC_OR_DEPARTMENT_OTHER)
Admission: EM | Admit: 2012-12-19 | Discharge: 2012-12-19 | Disposition: A | Discharge status: Home / Self Care | Payer: Medicaid Other | Attending: Emergency Medicine | Admitting: Emergency Medicine

## 2012-12-19 DIAGNOSIS — R110 Nausea: Secondary | ICD-10-CM | POA: Insufficient documentation

## 2012-12-19 DIAGNOSIS — G43909 Migraine, unspecified, not intractable, without status migrainosus: Secondary | ICD-10-CM | POA: Insufficient documentation

## 2012-12-19 DIAGNOSIS — H53149 Visual discomfort, unspecified: Secondary | ICD-10-CM | POA: Insufficient documentation

## 2012-12-19 DIAGNOSIS — Z79899 Other long term (current) drug therapy: Secondary | ICD-10-CM | POA: Insufficient documentation

## 2012-12-19 DIAGNOSIS — Q043 Other reduction deformities of brain: Secondary | ICD-10-CM | POA: Insufficient documentation

## 2012-12-19 DIAGNOSIS — G40909 Epilepsy, unspecified, not intractable, without status epilepticus: Secondary | ICD-10-CM | POA: Insufficient documentation

## 2012-12-19 MED ORDER — METOCLOPRAMIDE HCL 5 MG/ML IJ SOLN
10.0000 mg | Freq: Once | INTRAMUSCULAR | Status: AC
  Administered 2012-12-19: 10 mg via INTRAVENOUS
  Filled 2012-12-19: qty 2

## 2012-12-19 MED ORDER — SODIUM CHLORIDE 0.9 % IV BOLUS (SEPSIS)
1000.0000 mL | Freq: Once | INTRAVENOUS | Status: AC
  Administered 2012-12-19: 1000 mL via INTRAVENOUS

## 2012-12-19 MED ORDER — KETOROLAC TROMETHAMINE 30 MG/ML IJ SOLN
30.0000 mg | Freq: Once | INTRAMUSCULAR | Status: AC
  Administered 2012-12-19: 30 mg via INTRAVENOUS
  Filled 2012-12-19: qty 1

## 2012-12-19 MED ORDER — DIPHENHYDRAMINE HCL 50 MG/ML IJ SOLN
25.0000 mg | Freq: Once | INTRAMUSCULAR | Status: AC
  Administered 2012-12-19: 14:00:00 via INTRAVENOUS
  Filled 2012-12-19: qty 1

## 2012-12-19 MED ORDER — OXYCODONE-ACETAMINOPHEN 5-325 MG PO TABS: 1.0000 | ORAL_TABLET | ORAL | Status: DC | PRN

## 2012-12-19 NOTE — ED Provider Notes (Signed)
History     CSN: [id number]  Arrival date & time 12/19/12  1234   First MD Initiated Contact with Patient 12/19/12 1302      Chief Complaint  Patient presents with  . Migraine    (Consider location/radiation/quality/duration/timing/severity/associated sxs/prior treatment) HPI Comments: Patient is a 22 year old female with a past medical history of chronic migraines who presents with a headache for 3 days. Patient reports a gradual onset and progressive worsening of the headache. The pain is sharp, constant and is located in generalized head without radiation. Patient has tried topamax for symptoms without relief. No alleviating/aggravating factors. Patient reports associated nausea and photophobia. Patient denies fever, vomiting, diarrhea, numbness/tingling, weakness, visual changes, congestion, chest pain, SOB, abdominal pain.     Patient is a 22 y.o. female presenting with migraines.  Migraine Associated symptoms include headaches.    Past Medical History  Diagnosis Date  . Migraine   . 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 Polymicrogyria     Past Surgical History  Procedure Laterality Date  . Foot surgery    . Breast surgery      History reviewed. No pertinent family history.  History  Substance Use Topics  . Smoking status: Never Smoker   . Smokeless tobacco: Never Used  . Alcohol Use: Yes     Comment: occasionally    OB History   Grav Para Term Preterm Abortions TAB SAB Ect Mult Living                  Review of Systems  Neurological: Positive for headaches.  All other systems reviewed and are negative.    Allergies  Codeine; Oxycontin; and Rizatriptan  Home Medications   Current Outpatient Rx  Name  Route  Sig  Dispense  Refill  . lamoTRIgine (LAMICTAL) 200 MG tablet   Oral   Take 300 mg by mouth daily.         . norethindrone-ethinyl estradiol-iron (MICROGESTIN FE,GILDESS FE,LOESTRIN FE) 1.5-30 MG-MCG tablet   Oral   Take 1 tablet  by mouth daily.         . promethazine (PHENERGAN) 25 MG tablet   Oral   Take 25 mg by mouth every 6 (six) hours as needed.         . topiramate (TOPAMAX) 50 MG tablet   Oral   Take 50 mg by mouth 2 (two) times daily. 75mg  at night           BP 129/96  Pulse 85  Temp(Src) 98 F (36.7 C) (Oral)  Resp 15  Ht 5\' 2"  (1.575 m)  Wt 108 lb (48.988 kg)  BMI 19.75 kg/m2  SpO2 100%  LMP 11/30/2012  Physical Exam  Nursing note and vitals reviewed. Constitutional: She is oriented to person, place, and time. She appears well-developed and well-nourished. No distress.  HENT:  Head: Normocephalic and atraumatic.  Eyes: Conjunctivae and EOM are normal. Pupils are equal, round, and reactive to light. No scleral icterus.  Neck: Normal range of motion.  Cardiovascular: Normal rate and regular rhythm.  Exam reveals no gallop and no friction rub.   No murmur heard. Pulmonary/Chest: Effort normal and breath sounds normal. She has no wheezes. She has no rales. She exhibits no tenderness.  Abdominal: Soft. She exhibits no distension. There is no tenderness.  Musculoskeletal: Normal range of motion.  Neurological: She is alert and oriented to person, place, and time. Coordination normal.  Speech is goal-oriented.  Moves limbs without ataxia.   Skin: Skin is warm and dry.  Psychiatric: She has a normal mood and affect. Her behavior is normal.    ED Course  Procedures (including critical care time)  Labs Reviewed - No data to display No results found.   1. Migraine       MDM  2:07 PM Patient will have migraine cocktail with toradol, reglan, and benadryl.   4:26 PM Patient reports relief. I will discharge her with percocet and instruct her to follow up with her neurologist. Vitals stable. Patient afebrile. No further evaluation needed at this time. Patient instructed to return with worsening or concerning symptoms.       Emilia Beck, [place] 12/19/12 2205

## 2012-12-19 NOTE — ED Notes (Signed)
Pt has hx of migraines and was seen here 2 weeks ago for same. Appt March

## 2012-12-20 NOTE — ED Provider Notes (Signed)
Medical screening examination/treatment/procedure(s) were performed by non-physician practitioner and as supervising physician I was immediately available for consultation/collaboration.  Geoffery Lyons, MD 12/20/12 1556

## 2013-01-22 ENCOUNTER — Emergency Department (HOSPITAL_BASED_OUTPATIENT_CLINIC_OR_DEPARTMENT_OTHER)
Admission: EM | Admit: 2013-01-22 | Discharge: 2013-01-22 | Disposition: A | Discharge status: Home / Self Care | Payer: Medicaid Other | Attending: Emergency Medicine | Admitting: Emergency Medicine

## 2013-01-22 ENCOUNTER — Encounter (HOSPITAL_BASED_OUTPATIENT_CLINIC_OR_DEPARTMENT_OTHER): Payer: Self-pay

## 2013-01-22 DIAGNOSIS — R110 Nausea: Secondary | ICD-10-CM | POA: Insufficient documentation

## 2013-01-22 DIAGNOSIS — Q043 Other reduction deformities of brain: Secondary | ICD-10-CM | POA: Insufficient documentation

## 2013-01-22 DIAGNOSIS — G40909 Epilepsy, unspecified, not intractable, without status epilepticus: Secondary | ICD-10-CM | POA: Insufficient documentation

## 2013-01-22 DIAGNOSIS — G43909 Migraine, unspecified, not intractable, without status migrainosus: Secondary | ICD-10-CM | POA: Insufficient documentation

## 2013-01-22 DIAGNOSIS — Z79899 Other long term (current) drug therapy: Secondary | ICD-10-CM | POA: Insufficient documentation

## 2013-01-22 DIAGNOSIS — H53149 Visual discomfort, unspecified: Secondary | ICD-10-CM | POA: Insufficient documentation

## 2013-01-22 DIAGNOSIS — Z3202 Encounter for pregnancy test, result negative: Secondary | ICD-10-CM | POA: Insufficient documentation

## 2013-01-22 LAB — PREGNANCY, URINE: Preg Test, Ur: NEGATIVE

## 2013-01-22 MED ORDER — KETOROLAC TROMETHAMINE 30 MG/ML IJ SOLN
30.0000 mg | Freq: Once | INTRAMUSCULAR | Status: AC
  Administered 2013-01-22: 30 mg via INTRAVENOUS
  Filled 2013-01-22: qty 1

## 2013-01-22 MED ORDER — PROCHLORPERAZINE EDISYLATE 5 MG/ML IJ SOLN
10.0000 mg | Freq: Four times a day (QID) | INTRAMUSCULAR | Status: DC | PRN
  Filled 2013-01-22: qty 2

## 2013-01-22 MED ORDER — SODIUM CHLORIDE 0.9 % IV BOLUS (SEPSIS)
500.0000 mL | Freq: Once | INTRAVENOUS | Status: AC
  Administered 2013-01-22: 500 mL via INTRAVENOUS

## 2013-01-22 MED ORDER — PROCHLORPERAZINE EDISYLATE 5 MG/ML IJ SOLN
10.0000 mg | Freq: Once | INTRAMUSCULAR | Status: DC
  Filled 2013-01-22: qty 2

## 2013-01-22 MED ORDER — PROMETHAZINE HCL 25 MG/ML IJ SOLN
25.0000 mg | Freq: Once | INTRAMUSCULAR | Status: AC
  Administered 2013-01-22: 25 mg via INTRAVENOUS

## 2013-01-22 MED ORDER — PROMETHAZINE HCL 25 MG/ML IJ SOLN
INTRAMUSCULAR | Status: AC
  Administered 2013-01-22: 25 mg via INTRAVENOUS
  Filled 2013-01-22: qty 1

## 2013-01-22 NOTE — ED Provider Notes (Signed)
History     CSN: [id number]  Arrival date & time 01/22/13  0808   First MD Initiated Contact with Patient 01/22/13 [phone]      Chief Complaint  Patient presents with  . Migraine    (Consider location/radiation/quality/duration/timing/severity/associated sxs/prior treatment) Patient is a 22 y.o. female presenting with migraines. The history is provided by the patient.  Migraine Associated symptoms include headaches. Pertinent negatives include no chest pain, no abdominal pain and no shortness of breath.   patient is a history of migraine headaches. This one began yesterday. It is her typical throbbing headache but also has more nausea than normal. Mild photophobia. No trauma. No numbness or weakness. Patient is worried she may be pregnant. Her last period was March 8. She's been more hungry at night. No fevers. No abdominal pain. No vaginal bleeding or discharge.  Past Medical History  Diagnosis Date  . Migraine   . 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 Polymicrogyria     Past Surgical History  Procedure Laterality Date  . Foot surgery    . Breast surgery      History reviewed. No pertinent family history.  History  Substance Use Topics  . Smoking status: Never Smoker   . Smokeless tobacco: Never Used  . Alcohol Use: Yes     Comment: occasionally    OB History   Grav Para Term Preterm Abortions TAB SAB Ect Mult Living                  Review of Systems  Constitutional: Positive for appetite change. Negative for activity change.  HENT: Negative for neck stiffness.   Eyes: Positive for photophobia. Negative for pain and visual disturbance.  Respiratory: Negative for chest tightness and shortness of breath.   Cardiovascular: Negative for chest pain and leg swelling.  Gastrointestinal: Positive for nausea. Negative for vomiting, abdominal pain and diarrhea.  Genitourinary: Negative for flank pain.  Musculoskeletal: Negative for back pain.  Skin: Negative for  rash.  Neurological: Positive for headaches. Negative for weakness and numbness.  Psychiatric/Behavioral: Negative for behavioral problems.    Allergies  Codeine; Oxycontin; and Rizatriptan  Home Medications   Current Outpatient Rx  Name  Route  Sig  Dispense  Refill  . lamoTRIgine (LAMICTAL) 200 MG tablet   Oral   Take 300 mg by mouth daily.         . norethindrone-ethinyl estradiol-iron (MICROGESTIN FE,GILDESS FE,LOESTRIN FE) 1.5-30 MG-MCG tablet   Oral   Take 1 tablet by mouth daily.         Marland Kitchen topiramate (TOPAMAX) 50 MG tablet   Oral   Take 50 mg by mouth 2 (two) times daily.          Marland Kitchen oxyCODONE-acetaminophen (PERCOCET/ROXICET) 5-325 MG per tablet   Oral   Take 1-2 tablets by mouth every 4 (four) hours as needed for pain.   12 tablet   0   . promethazine (PHENERGAN) 25 MG tablet   Oral   Take 25 mg by mouth every 6 (six) hours as needed.           BP 118/78  Pulse 81  Temp(Src) 97.9 F (36.6 C) (Oral)  Resp 16  Ht 5\' 2"  (1.575 m)  Wt 106 lb (48.081 kg)  BMI 19.38 kg/m2  SpO2 100%  LMP 01/01/2013  Physical Exam  Nursing note and vitals reviewed. Constitutional: She is oriented to person, place, and time. She appears well-developed and  well-nourished.  HENT:  Head: Normocephalic and atraumatic.  Eyes: EOM are normal. Pupils are equal, round, and reactive to light.  Neck: Normal range of motion. Neck supple.  Cardiovascular: Normal rate, regular rhythm and normal heart sounds.   No murmur heard. Pulmonary/Chest: Effort normal and breath sounds normal. No respiratory distress. She has no wheezes. She has no rales.  Abdominal: Soft. Bowel sounds are normal. She exhibits no distension. There is tenderness. There is no rebound and no guarding.  Mild diffuse abdominal tenderness without rebound or guarding.  Musculoskeletal: Normal range of motion.  Neurological: She is alert and oriented to person, place, and time. No cranial nerve deficit.  Skin:  Skin is warm and dry.  Psychiatric: She has a normal mood and affect. Her speech is normal.    ED Course  Procedures (including critical care time)  Labs Reviewed  PREGNANCY, URINE   No results found.   1. Migraine       MDM  Patient with headache with history of same. Feels better after treatment. She is tolerated some orals will be discharged home.        Juliet Rude. Rubin Payor, MD 01/22/13 [phone]

## 2013-01-22 NOTE — ED Notes (Addendum)
Pt states that she feels much better and now wishes to be d/c'd to home.  Dr Rubin Payor notified.  Discharge to be completed shortly.

## 2013-01-22 NOTE — ED Notes (Signed)
Pt states that she has migraine headache onset yesterday, was given new rx by PCP to try for migraines and it helped at first last night, but headache still persists.  Pt c/o nausea, photophobia and phonophobia.

## 2013-01-22 NOTE — ED Notes (Signed)
Waiting for urine pregnancy result before admin of Compazine

## 2013-01-23 ENCOUNTER — Encounter (HOSPITAL_BASED_OUTPATIENT_CLINIC_OR_DEPARTMENT_OTHER): Payer: Self-pay | Admitting: *Deleted

## 2013-01-23 ENCOUNTER — Emergency Department (HOSPITAL_BASED_OUTPATIENT_CLINIC_OR_DEPARTMENT_OTHER)
Admission: EM | Admit: 2013-01-23 | Discharge: 2013-01-23 | Disposition: A | Discharge status: Home / Self Care | Payer: Medicaid Other | Attending: Emergency Medicine | Admitting: Emergency Medicine

## 2013-01-23 DIAGNOSIS — Z79899 Other long term (current) drug therapy: Secondary | ICD-10-CM | POA: Insufficient documentation

## 2013-01-23 DIAGNOSIS — Z87728 Personal history of other specified (corrected) congenital malformations of nervous system and sense organs: Secondary | ICD-10-CM | POA: Insufficient documentation

## 2013-01-23 DIAGNOSIS — R110 Nausea: Secondary | ICD-10-CM | POA: Insufficient documentation

## 2013-01-23 DIAGNOSIS — H5789 Other specified disorders of eye and adnexa: Secondary | ICD-10-CM | POA: Insufficient documentation

## 2013-01-23 DIAGNOSIS — G40909 Epilepsy, unspecified, not intractable, without status epilepticus: Secondary | ICD-10-CM | POA: Insufficient documentation

## 2013-01-23 DIAGNOSIS — R51 Headache: Secondary | ICD-10-CM

## 2013-01-23 DIAGNOSIS — G43909 Migraine, unspecified, not intractable, without status migrainosus: Secondary | ICD-10-CM | POA: Insufficient documentation

## 2013-01-23 MED ORDER — KETOROLAC TROMETHAMINE 30 MG/ML IJ SOLN
30.0000 mg | Freq: Once | INTRAMUSCULAR | Status: AC
  Administered 2013-01-23: 30 mg via INTRAMUSCULAR
  Filled 2013-01-23: qty 1

## 2013-01-23 MED ORDER — ACETAMINOPHEN 325 MG PO TABS
650.0000 mg | ORAL_TABLET | Freq: Once | ORAL | Status: AC
  Administered 2013-01-23: 650 mg via ORAL
  Filled 2013-01-23: qty 2

## 2013-01-23 MED ORDER — DIPHENHYDRAMINE HCL 50 MG/ML IJ SOLN
50.0000 mg | Freq: Once | INTRAMUSCULAR | Status: AC
  Administered 2013-01-23: 50 mg via INTRAMUSCULAR
  Filled 2013-01-23: qty 1

## 2013-01-23 NOTE — ED Notes (Signed)
Pt placed on O2 for headache.

## 2013-01-23 NOTE — ED Notes (Signed)
Pt states she was seen here yesterday for same, but migraine returned last p.m.

## 2013-01-23 NOTE — ED Provider Notes (Signed)
History     CSN: [id number]  Arrival date & time 01/23/13  1318   First MD Initiated Contact with Patient 01/23/13 1319      Chief Complaint  Patient presents with  . Migraine     Patient is a 22 y.o. female presenting with migraines. The history is provided by the patient.  Migraine This is a recurrent problem. Episode onset: last night. The problem occurs constantly. The problem has been gradually worsening. Associated symptoms include headaches. Pertinent negatives include no abdominal pain. Exacerbated by: light. Treatments tried: home medications. The treatment provided no relief.  pt presents headache She reports long h/o headaches.  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 Migraine   . 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 Polymicrogyria     Past Surgical History  Procedure Laterality Date  . Foot surgery    . Breast surgery      History reviewed. No pertinent family history.  History  Substance Use Topics  . Smoking status: Never Smoker   . Smokeless tobacco: Never Used  . Alcohol Use: Yes     Comment: occasionally    OB History   Grav Para Term Preterm Abortions TAB SAB Ect Mult Living                  Review of Systems  Constitutional: Negative for fever.  Eyes: Positive for discharge.       Lacrimation   Gastrointestinal: Positive for nausea. Negative for abdominal pain.  Musculoskeletal: Negative for back pain.  Skin: Negative for color change.  Neurological: Positive for headaches. Negative  for seizures and weakness.  Psychiatric/Behavioral: Negative for agitation.  All other systems reviewed and are negative.    Allergies  Codeine; Oxycontin; and Rizatriptan  Home Medications   Current Outpatient Rx  Name  Route  Sig  Dispense  Refill  . lamoTRIgine (LAMICTAL) 200 MG tablet   Oral   Take 300 mg by mouth daily.         . norethindrone-ethinyl estradiol-iron (MICROGESTIN FE,GILDESS FE,LOESTRIN FE) 1.5-30 MG-MCG tablet   Oral   Take 1 tablet by mouth daily.         Marland Kitchen oxyCODONE-acetaminophen (PERCOCET/ROXICET) 5-325 MG per tablet   Oral   Take 1-2 tablets by mouth every 4 (four) hours as needed for pain.   12 tablet   0   . promethazine (PHENERGAN) 25 MG tablet   Oral   Take 25 mg by mouth every 6 (six) hours as needed.         . topiramate (TOPAMAX) 50 MG tablet   Oral   Take 50 mg by mouth 2 (two) times daily.            BP 129/80  Pulse 90  Temp(Src) 98.2 F (36.8 C) (Oral)  Resp 18  Ht 5\' 2"  (1.575 m)  Wt 108 lb (48.988 kg)  BMI 19.75 kg/m2  SpO2 99%  LMP 01/01/2013  Physical Exam CONSTITUTIONAL:  Well developed/well nourished HEAD: Normocephalic/atraumatic, tender along right fronto-parietal scalp but no bruising or erythema noted EYES: EOMI/PERRL, no nystagmus,no conjunctival injection.  She has mild right eyelid edema.  No ptosis.  No bruising noted ENMT: Mucous membranes moist NECK: supple no meningeal signs, no bruits SPINE:entire spine nontender CV: S1/S2 noted, no murmurs/rubs/gallops noted LUNGS: Lungs are clear to auscultation bilaterally, no apparent distress ABDOMEN: soft, nontender, no rebound or guarding NEURO:Awake/alert, facies symmetric, no arm or leg drift is noted Cranial nerves 3/4/5/6/05/04/09/11/12 tested and intact No ataxia No past pointing EXTREMITIES: pulses normal, full ROM SKIN: warm, color normal PSYCH: no abnormalities of mood noted   ED Course  Procedures   Medications  ketorolac (TORADOL) 30  MG/ML injection 30 mg (30 mg Intramuscular Given 01/23/13 1410)  diphenhydrAMINE (BENADRYL) injection 50 mg (50 mg Intramuscular Given 01/23/13 1410)    Pt here for headache - this could represent cluster headache given location/symptoms.  She is in no distress, smiling and talkative.  She is ambulatory.  She is afebrile She reports long h/o HA - she has had multiple ER visits for HA and has specialist in [place].  She reports that her daily HA meds were recently changed and she thinks this may be causing the increase in Headaches My suspicion for SAH/meningitis other acute neurologic process   Pt reports pain is still present but mildly improved She has multiple allergies Will give tylenol Advised to call her neurologist in [facility] tomorrow  MDM  Nursing notes including past medical history and social history reviewed and considered in documentation Previous records reviewed and considered         Joya Gaskins, MD 01/23/13 1451

## 2013-02-26 ENCOUNTER — Emergency Department (HOSPITAL_BASED_OUTPATIENT_CLINIC_OR_DEPARTMENT_OTHER)
Admission: EM | Admit: 2013-02-26 | Discharge: 2013-02-26 | Disposition: A | Discharge status: Home / Self Care | Payer: Medicaid Other | Attending: Emergency Medicine | Admitting: Emergency Medicine

## 2013-02-26 ENCOUNTER — Encounter (HOSPITAL_BASED_OUTPATIENT_CLINIC_OR_DEPARTMENT_OTHER): Payer: Self-pay | Admitting: *Deleted

## 2013-02-26 DIAGNOSIS — Z79899 Other long term (current) drug therapy: Secondary | ICD-10-CM | POA: Insufficient documentation

## 2013-02-26 DIAGNOSIS — Q043 Other reduction deformities of brain: Secondary | ICD-10-CM | POA: Insufficient documentation

## 2013-02-26 DIAGNOSIS — R51 Headache: Secondary | ICD-10-CM

## 2013-02-26 DIAGNOSIS — G40909 Epilepsy, unspecified, not intractable, without status epilepticus: Secondary | ICD-10-CM | POA: Insufficient documentation

## 2013-02-26 DIAGNOSIS — G43909 Migraine, unspecified, not intractable, without status migrainosus: Secondary | ICD-10-CM | POA: Insufficient documentation

## 2013-02-26 MED ORDER — METOCLOPRAMIDE HCL 5 MG/ML IJ SOLN
10.0000 mg | Freq: Once | INTRAMUSCULAR | Status: AC
  Administered 2013-02-26: 10 mg via INTRAVENOUS
  Filled 2013-02-26: qty 2

## 2013-02-26 MED ORDER — KETOROLAC TROMETHAMINE 30 MG/ML IJ SOLN
30.0000 mg | Freq: Once | INTRAMUSCULAR | Status: AC
  Administered 2013-02-26: 30 mg via INTRAVENOUS
  Filled 2013-02-26: qty 1

## 2013-02-26 MED ORDER — DIPHENHYDRAMINE HCL 50 MG/ML IJ SOLN
25.0000 mg | Freq: Once | INTRAMUSCULAR | Status: AC
  Administered 2013-02-26: 25 mg via INTRAVENOUS
  Filled 2013-02-26: qty 1

## 2013-02-26 MED ORDER — SODIUM CHLORIDE 0.9 % IV SOLN
INTRAVENOUS | Status: DC
  Administered 2013-02-26: 13:00:00 via INTRAVENOUS

## 2013-02-26 NOTE — ED Notes (Signed)
Headache x 3 days. Now c/o nausea. Hx same and has been here as well.

## 2013-02-26 NOTE — ED Provider Notes (Signed)
History     CSN: [id number]  Arrival date & time 02/26/13  1131   First MD Initiated Contact with Patient 02/26/13 1159      Chief Complaint  Patient presents with  . Headache    (Consider location/radiation/quality/duration/timing/severity/associated sxs/prior treatment) HPI Comments: Patient presents emergency department with chief complaint of headache x3 days. This is not a new problem. The patient has past medical history remarkable for migraines. She states this feels like her typical migraine. She states that her pain is 10 out of 10. She states the pain is more located on the right side of her head. She states that her headache has gradually worsened since Thursday. Does not sudden in onset. She denies any fevers, chills, numbness, tingling, or weakness of the extremities. She denies any visual complaints, or difficulty walking.  The history is provided by the patient. No language interpreter was used.    Past Medical History  Diagnosis Date  . Migraine   . 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 Polymicrogyria     Past Surgical History  Procedure Laterality Date  . Foot surgery    . Breast surgery      History reviewed. No pertinent family history.  History  Substance Use Topics  . Smoking status: Never Smoker   . Smokeless tobacco: Never Used  . Alcohol Use: Yes     Comment: occasionally    OB History   Grav Para Term Preterm Abortions TAB SAB Ect Mult Living                  Review of Systems  All other systems reviewed and are negative.    Allergies  Codeine; Oxycontin; and Rizatriptan  Home Medications   Current Outpatient Rx  Name  Route  Sig  Dispense  Refill  . lamoTRIgine (LAMICTAL) 200 MG tablet   Oral   Take 300 mg by mouth daily.         . norethindrone-ethinyl estradiol-iron (MICROGESTIN FE,GILDESS FE,LOESTRIN FE) 1.5-30 MG-MCG tablet   Oral   Take 1 tablet by mouth daily.         Marland Kitchen oxyCODONE-acetaminophen  (PERCOCET/ROXICET) 5-325 MG per tablet   Oral   Take 1-2 tablets by mouth every 4 (four) hours as needed for pain.   12 tablet   0   . promethazine (PHENERGAN) 25 MG tablet   Oral   Take 25 mg by mouth every 6 (six) hours as needed.         . topiramate (TOPAMAX) 50 MG tablet   Oral   Take 50 mg by mouth 2 (two) times daily.            BP 126/82  Pulse 96  Temp(Src) 98.2 F (36.8 C) (Oral)  Resp 18  Ht 5\' 2"  (1.575 m)  Wt 110 lb (49.896 kg)  BMI 20.11 kg/m2  SpO2 97%  LMP 02/19/2013  Physical Exam  Nursing note and vitals reviewed. Constitutional: She is oriented to person, place, and time. She appears well-developed and well-nourished.  HENT:  Head: Normocephalic and atraumatic.  Right Ear: External ear normal.  Left Ear: External ear normal.  Non-tender over temporal artery, no increased pain with chewing.  Eyes: Conjunctivae and EOM are normal. Pupils are equal, round, and reactive to light.  No papilledema  Neck: Normal range of motion. Neck supple.  No pain with neck flexion, no meningismus  Cardiovascular: Normal rate, regular rhythm and normal heart sounds.  Exam reveals no gallop and no friction rub.   No murmur heard. Pulmonary/Chest: Effort normal and breath sounds normal. No respiratory distress. She has no wheezes. She has no rales. She exhibits no tenderness.  Abdominal: Soft. Bowel sounds are normal. She exhibits no distension and no mass. There is no tenderness. There is no rebound and no guarding.  Musculoskeletal: Normal range of motion. She exhibits no edema and no tenderness.  Normal gait.  Neurological: She is alert and oriented to person, place, and time. She has normal reflexes.  CN 3-12 intact, no pronator drift, normal shin to heel, normal RAM, sensation and strength intact bilaterally.  Skin: Skin is warm and dry.  Psychiatric: She has a normal mood and affect. Her behavior is normal. Judgment and thought content normal.    ED Course   Procedures (including critical care time)    1. Headache       MDM  Patient with typical migraine. Will treat with migraine cocktail. No signs of meningismus, fever, or signs to suggest a meningitis or SAH. Patient is able to ambulate appropriately. No weakness, numbness, or tingling of the extremities. Will reevaluate following migraine cocktail. Patient has a headache specialist at [facility].  1:37 PM Patient states that her headache feels much better now.  She states that she is ready to go home.  She is stable and ready for discharge.  Follow-up with her neurologist.        Roxy Horseman, PA-C 02/26/13 1338

## 2013-02-26 NOTE — ED Provider Notes (Signed)
Medical screening examination/treatment/procedure(s) were performed by non-physician practitioner and as supervising physician I was immediately available for consultation/collaboration.   Charles B. Sheldon, MD 02/26/13 1454 

## 2013-04-21 ENCOUNTER — Other Ambulatory Visit: Payer: Self-pay | Admitting: Family Medicine

## 2013-04-21 DIAGNOSIS — N649 Disorder of breast, unspecified: Secondary | ICD-10-CM

## 2013-04-21 DIAGNOSIS — Z9889 Other specified postprocedural states: Secondary | ICD-10-CM

## 2013-04-28 ENCOUNTER — Ambulatory Visit
Admission: RE | Admit: 2013-04-28 | Discharge: 2013-04-28 | Disposition: A | Discharge status: Home / Self Care | Payer: Medicaid Other | Source: Ambulatory Visit | Attending: Family Medicine | Admitting: Family Medicine

## 2013-04-28 DIAGNOSIS — N649 Disorder of breast, unspecified: Secondary | ICD-10-CM

## 2013-04-28 DIAGNOSIS — Z9889 Other specified postprocedural states: Secondary | ICD-10-CM

## 2013-05-24 ENCOUNTER — Emergency Department (HOSPITAL_BASED_OUTPATIENT_CLINIC_OR_DEPARTMENT_OTHER)
Admission: EM | Admit: 2013-05-24 | Discharge: 2013-05-24 | Disposition: A | Discharge status: Home / Self Care | Payer: Medicaid Other | Attending: Emergency Medicine | Admitting: Emergency Medicine

## 2013-05-24 ENCOUNTER — Encounter (HOSPITAL_BASED_OUTPATIENT_CLINIC_OR_DEPARTMENT_OTHER): Payer: Self-pay

## 2013-05-24 DIAGNOSIS — G43909 Migraine, unspecified, not intractable, without status migrainosus: Secondary | ICD-10-CM | POA: Insufficient documentation

## 2013-05-24 DIAGNOSIS — Z79899 Other long term (current) drug therapy: Secondary | ICD-10-CM | POA: Insufficient documentation

## 2013-05-24 DIAGNOSIS — R569 Unspecified convulsions: Secondary | ICD-10-CM | POA: Insufficient documentation

## 2013-05-24 DIAGNOSIS — R519 Headache, unspecified: Secondary | ICD-10-CM

## 2013-05-24 DIAGNOSIS — R110 Nausea: Secondary | ICD-10-CM | POA: Insufficient documentation

## 2013-05-24 DIAGNOSIS — Z8669 Personal history of other diseases of the nervous system and sense organs: Secondary | ICD-10-CM | POA: Insufficient documentation

## 2013-05-24 MED ORDER — KETOROLAC TROMETHAMINE 30 MG/ML IJ SOLN
30.0000 mg | Freq: Once | INTRAMUSCULAR | Status: AC
  Administered 2013-05-24: 30 mg via INTRAVENOUS
  Filled 2013-05-24: qty 1

## 2013-05-24 MED ORDER — DIPHENHYDRAMINE HCL 50 MG/ML IJ SOLN
25.0000 mg | Freq: Once | INTRAMUSCULAR | Status: AC
  Administered 2013-05-24: 25 mg via INTRAVENOUS
  Filled 2013-05-24: qty 1

## 2013-05-24 MED ORDER — SODIUM CHLORIDE 0.9 % IV SOLN
Freq: Once | INTRAVENOUS | Status: AC
  Administered 2013-05-24: 14:00:00 via INTRAVENOUS

## 2013-05-24 MED ORDER — METOCLOPRAMIDE HCL 5 MG/ML IJ SOLN
10.0000 mg | Freq: Once | INTRAMUSCULAR | Status: AC
  Administered 2013-05-24: 10 mg via INTRAVENOUS
  Filled 2013-05-24: qty 2

## 2013-05-24 NOTE — ED Provider Notes (Signed)
History/physical exam/procedure(s) were performed by non-physician practitioner and as supervising physician I was immediately available for consultation/collaboration. I have reviewed all notes and am in agreement with care and plan.   Gerri Acre S Anjali Manzella, MD 05/24/13 1601 

## 2013-05-24 NOTE — ED Notes (Signed)
Pt reports a headache that started yesterday unrelieved after taking prescribed medications.

## 2013-05-24 NOTE — ED Provider Notes (Signed)
CSN: [id number]     Arrival date & time 05/24/13  1139 History     First MD Initiated Contact with Patient 05/24/13 1253     Chief Complaint  Patient presents with  . Headache  . Nausea   (Consider location/radiation/quality/duration/timing/severity/associated sxs/prior Treatment) Patient is a 22 y.o. female presenting with headaches. The history is provided by the patient. No language interpreter was used.  Headache Pain location:  Generalized Radiates to:  Does not radiate Severity currently:  8/10 Severity at highest:  8/10 Onset quality:  Sudden Duration:  1 day Timing:  Constant Progression:  Worsening Chronicity:  New Similar to prior headaches: yes   Relieved by:  Nothing Worsened by:  Nothing tried Ineffective treatments:  None tried Risk factors: no family hx of SAH   Pt complains of a headache.   No relief from home medications.     Past Medical History  Diagnosis Date  . Migraine   . 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 Polymicrogyria    Past Surgical History  Procedure Laterality Date  . Foot surgery    . Breast surgery     No family history on file. History  Substance Use Topics  . Smoking status: Never Smoker   . Smokeless tobacco: Never Used  . Alcohol Use: No     Comment: occasionally   OB History   Grav Para Term Preterm Abortions TAB SAB Ect Mult Living                 Review of Systems  Neurological: Positive for headaches.  All other systems reviewed and are negative.    Allergies  Codeine; Oxycontin; and Rizatriptan  Home Medications   Current Outpatient Rx  Name  Route  Sig  Dispense  Refill  . metoCLOPramide (REGLAN) 10 MG tablet   Oral   Take 10 mg by mouth 4 (four) times daily.         Marland Kitchen lamoTRIgine (LAMICTAL) 200 MG tablet   Oral   Take 300 mg by mouth daily.         . norethindrone-ethinyl estradiol-iron (MICROGESTIN FE,GILDESS FE,LOESTRIN FE) 1.5-30 MG-MCG tablet   Oral   Take 1 tablet by mouth daily.        . promethazine (PHENERGAN) 25 MG tablet   Oral   Take 25 mg by mouth every 6 (six) hours as needed.         . topiramate (TOPAMAX) 50 MG tablet   Oral   Take 50 mg by mouth 2 (two) times daily.           BP 105/65  Pulse 85  Temp(Src) 98.4 F (36.9 C) (Oral)  Resp 16  Ht 5\' 2"  (1.575 m)  Wt 108 lb (48.988 kg)  BMI 19.75 kg/m2  SpO2 100%  LMP 04/26/2013 Physical Exam  Nursing note and vitals reviewed. Constitutional: She is oriented to person, place, and time. She appears well-developed and well-nourished.  HENT:  Head: Normocephalic and atraumatic.  Right Ear: External ear normal.  Left Ear: External ear normal.  Nose: Nose normal.  Mouth/Throat: Oropharynx is clear and moist.  Eyes: Conjunctivae are normal. Pupils are equal, round, and reactive to light.  Neck: Normal range of motion. Neck supple.  Cardiovascular: Normal rate.   Pulmonary/Chest: Effort normal.  Abdominal: Soft.  Musculoskeletal: Normal range of motion.  Neurological: She is alert and oriented to person, place, and time. She has normal reflexes.  Skin: Skin  is warm.  Psychiatric: She has a normal mood and affect.    ED Course   Procedures (including critical care time)  Labs Reviewed - No data to display No results found. 1. Headache     MDM  Pt reports [facility] cocktail works weel.  Pt given a liter of fluids and medications.   Pt advised to follow up with her Md  Elson Areas, [place] 05/24/13 1507

## 2013-06-06 ENCOUNTER — Encounter (HOSPITAL_BASED_OUTPATIENT_CLINIC_OR_DEPARTMENT_OTHER): Payer: Self-pay | Admitting: *Deleted

## 2013-06-06 ENCOUNTER — Emergency Department (HOSPITAL_BASED_OUTPATIENT_CLINIC_OR_DEPARTMENT_OTHER)
Admission: EM | Admit: 2013-06-06 | Discharge: 2013-06-06 | Disposition: A | Discharge status: Home / Self Care | Payer: Medicaid Other | Attending: Emergency Medicine | Admitting: Emergency Medicine

## 2013-06-06 DIAGNOSIS — M542 Cervicalgia: Secondary | ICD-10-CM | POA: Insufficient documentation

## 2013-06-06 DIAGNOSIS — G43909 Migraine, unspecified, not intractable, without status migrainosus: Secondary | ICD-10-CM | POA: Insufficient documentation

## 2013-06-06 DIAGNOSIS — R110 Nausea: Secondary | ICD-10-CM | POA: Insufficient documentation

## 2013-06-06 DIAGNOSIS — Q043 Other reduction deformities of brain: Secondary | ICD-10-CM | POA: Insufficient documentation

## 2013-06-06 DIAGNOSIS — G40909 Epilepsy, unspecified, not intractable, without status epilepticus: Secondary | ICD-10-CM | POA: Insufficient documentation

## 2013-06-06 DIAGNOSIS — Z79899 Other long term (current) drug therapy: Secondary | ICD-10-CM | POA: Insufficient documentation

## 2013-06-06 MED ORDER — SODIUM CHLORIDE 0.9 % IV BOLUS (SEPSIS)
1000.0000 mL | Freq: Once | INTRAVENOUS | Status: AC
  Administered 2013-06-06: 1000 mL via INTRAVENOUS

## 2013-06-06 MED ORDER — KETOROLAC TROMETHAMINE 30 MG/ML IJ SOLN
30.0000 mg | Freq: Once | INTRAMUSCULAR | Status: AC
  Administered 2013-06-06: 30 mg via INTRAVENOUS
  Filled 2013-06-06: qty 1

## 2013-06-06 MED ORDER — DIPHENHYDRAMINE HCL 50 MG/ML IJ SOLN
25.0000 mg | Freq: Once | INTRAMUSCULAR | Status: AC
  Administered 2013-06-06: 25 mg via INTRAVENOUS
  Filled 2013-06-06: qty 1

## 2013-06-06 MED ORDER — PROMETHAZINE HCL 25 MG/ML IJ SOLN
25.0000 mg | Freq: Once | INTRAMUSCULAR | Status: DC
  Filled 2013-06-06: qty 1

## 2013-06-06 MED ORDER — METOCLOPRAMIDE HCL 5 MG/ML IJ SOLN
10.0000 mg | Freq: Once | INTRAMUSCULAR | Status: AC
  Administered 2013-06-06: 10 mg via INTRAVENOUS
  Filled 2013-06-06: qty 2

## 2013-06-06 NOTE — ED Provider Notes (Signed)
I saw and evaluated the patient, reviewed the resident's note and I agree with the findings and plan.   .Face to face Exam:  General:  Awake HEENT:  Atraumatic Resp:  Normal effort Abd:  Nondistended Neuro:No focal weakness Lymph: No adenopathy   Nelia Shi, MD 06/06/13 1000

## 2013-06-06 NOTE — ED Notes (Signed)
Patient c/o migraine for the past three days, took OTC migraine medicine & benadryl last night with reglan, but no relief

## 2013-06-06 NOTE — ED Provider Notes (Signed)
CSN: [id number]     Arrival date & time 06/06/13  2952 History     None    Chief Complaint  Patient presents with  . Migraine   (Consider location/radiation/quality/duration/timing/severity/associated sxs/prior Treatment) HPI 22 year old female with a long standing history of migraines presents with migraine headache.  Patient typically has at least 1 migraine/week.  Patient reports that her migraine began 3 days ago.   It began suddenly and presented as her typical migraine with right, posterior suboccipital and cervical pain.  Pain 10/10 in severity and described as sharp, throbbing pain.  Pain radiates to right temporal region.  Associated symptoms: nausea (no vomiting).  No associated photophobia or phonophobia.  Patient tried OTC Excedrin, Phenergan and Reglan with no relief in pain.  Nothing has improved her pain.  No exacerbating factors.   Past Medical History  Diagnosis Date  . Migraine   . 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 Polymicrogyria    Past Surgical History  Procedure Laterality Date  . Foot surgery    . Breast surgery     No family history on file. History  Substance Use Topics  . Smoking status: Never Smoker   . Smokeless tobacco: Never Used  . Alcohol Use: No     Comment: occasionally   OB History   Grav Para Term Preterm Abortions TAB SAB Ect Mult Living                 Review of Systems  Respiratory: Negative for shortness of breath.   Neurological:       Patient has history of seizures.   Allergies  Codeine; Oxycontin; and Rizatriptan  Home Medications   Current Outpatient Rx  Name  Route  Sig  Dispense  Refill  . lamoTRIgine (LAMICTAL) 200 MG tablet   Oral   Take 300 mg by mouth daily.         . metoCLOPramide (REGLAN) 10 MG tablet   Oral   Take 10 mg by mouth 4 (four) times daily.         . norethindrone-ethinyl estradiol-iron (MICROGESTIN FE,GILDESS FE,LOESTRIN FE) 1.5-30 MG-MCG tablet   Oral   Take 1 tablet by mouth  daily.         . promethazine (PHENERGAN) 25 MG tablet   Oral   Take 25 mg by mouth every 6 (six) hours as needed.         . topiramate (TOPAMAX) 50 MG tablet   Oral   Take 50 mg by mouth 2 (two) times daily.           BP 121/75  Pulse 66  Temp(Src) 98.1 F (36.7 C) (Oral)  Resp 16  Ht 5\' 2"  (1.575 m)  Wt 108 lb (48.988 kg)  BMI 19.75 kg/m2  SpO2 100%  LMP 04/26/2013 Physical Exam  Constitutional: She is oriented to person, place, and time. She appears well-developed and well-nourished. No distress.  HENT:  Head: Normocephalic and atraumatic.  Eyes: EOM are normal. Pupils are equal, round, and reactive to light. No scleral icterus.  Neck: Neck supple.  Mild posterior, right sub occipital and cervical tenderness.   Cardiovascular: Normal rate and regular rhythm.   No murmur heard. Pulmonary/Chest: Effort normal and breath sounds normal. She has no wheezes. She has no rales. She exhibits no tenderness.  Abdominal: Soft. She exhibits no distension. There is no tenderness.  Musculoskeletal: She exhibits no edema.  Neurological: She is alert and  oriented to person, place, and time. No cranial nerve deficit.  Muscle strength 5/5 in all extremities.  Sensation grossly intact.  Skin: Skin is warm and dry.  Psychiatric: She has a normal mood and affect.   ED Course   Procedures (including critical care time)  Labs Reviewed - No data to display No results found. No diagnosis found.  MDM  22 year old female with known history of chronic migraines presents with migraine. - Patient reports that prior regimens that she has received here have worked well.  Regimen consists of IV fluids, Reglan, Benadryl and Toradol. - Will give above cocktail and monitor for improvement.  4098 - Patient feeling "loopy". No relief from headache yet.  1191 - Patient now feeling much better with minimal headache.  Patient stable and amendable for discharge.  Tommie Sams, DO 06/06/13  [phone]

## 2013-06-21 ENCOUNTER — Emergency Department (HOSPITAL_BASED_OUTPATIENT_CLINIC_OR_DEPARTMENT_OTHER)
Admission: EM | Admit: 2013-06-21 | Discharge: 2013-06-21 | Disposition: A | Discharge status: Home / Self Care | Payer: Medicaid Other | Attending: Emergency Medicine | Admitting: Emergency Medicine

## 2013-06-21 ENCOUNTER — Encounter (HOSPITAL_BASED_OUTPATIENT_CLINIC_OR_DEPARTMENT_OTHER): Payer: Self-pay

## 2013-06-21 DIAGNOSIS — R51 Headache: Secondary | ICD-10-CM | POA: Insufficient documentation

## 2013-06-21 DIAGNOSIS — G40909 Epilepsy, unspecified, not intractable, without status epilepticus: Secondary | ICD-10-CM | POA: Insufficient documentation

## 2013-06-21 DIAGNOSIS — G43909 Migraine, unspecified, not intractable, without status migrainosus: Secondary | ICD-10-CM | POA: Insufficient documentation

## 2013-06-21 DIAGNOSIS — Z79899 Other long term (current) drug therapy: Secondary | ICD-10-CM | POA: Insufficient documentation

## 2013-06-21 DIAGNOSIS — R519 Headache, unspecified: Secondary | ICD-10-CM

## 2013-06-21 DIAGNOSIS — Q043 Other reduction deformities of brain: Secondary | ICD-10-CM | POA: Insufficient documentation

## 2013-06-21 DIAGNOSIS — R110 Nausea: Secondary | ICD-10-CM | POA: Insufficient documentation

## 2013-06-21 MED ORDER — METOCLOPRAMIDE HCL 5 MG/ML IJ SOLN
10.0000 mg | Freq: Once | INTRAMUSCULAR | Status: AC
  Administered 2013-06-21: 10 mg via INTRAVENOUS
  Filled 2013-06-21: qty 2

## 2013-06-21 MED ORDER — DIPHENHYDRAMINE HCL 50 MG/ML IJ SOLN
25.0000 mg | Freq: Once | INTRAMUSCULAR | Status: AC
  Administered 2013-06-21: 25 mg via INTRAVENOUS
  Filled 2013-06-21: qty 1

## 2013-06-21 MED ORDER — KETOROLAC TROMETHAMINE 30 MG/ML IJ SOLN
30.0000 mg | Freq: Once | INTRAMUSCULAR | Status: AC
  Administered 2013-06-21: 30 mg via INTRAVENOUS
  Filled 2013-06-21: qty 1

## 2013-06-21 MED ORDER — SODIUM CHLORIDE 0.9 % IV BOLUS (SEPSIS)
1000.0000 mL | Freq: Once | INTRAVENOUS | Status: AC
  Administered 2013-06-21: 1000 mL via INTRAVENOUS

## 2013-06-21 NOTE — ED Notes (Signed)
Mother to nurses station and states pt is feeling better and ready to go, EDP notified.

## 2013-06-21 NOTE — ED Provider Notes (Signed)
CSN: [id number]     Arrival date & time 06/21/13  1030 History   First MD Initiated Contact with Patient 06/21/13 1121     Chief Complaint  Patient presents with  . Headache   (Consider location/radiation/quality/duration/timing/severity/associated sxs/prior Treatment) HPI Comments: 22 year old female with a history of frequent migraines who also has a history of seizure disorder. She presents after starting to have a headache last night which is right-sided, involving the right temporal area and radiating into her right neck. This is severe, 10 of 10, throbbing, worse with turning her head to the right, no pain with turning the head to the left, no fevers chills photosensitivity. She does have nausea, has not had any vomiting. She took her medications last night including Reglan and over-the-counter medications with minimal improvement. She does have frequent headaches, she keeps a log of these headaches and states that she has them approximately twice a week, this was slightly different in that involves the right side of her neck but is always in the same location on the right side of the head.  Patient is a 22 y.o. female presenting with headaches. The history is provided by the patient.  Headache   Past Medical History  Diagnosis Date  . Migraine   . 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 Polymicrogyria    Past Surgical History  Procedure Laterality Date  . Foot surgery    . Breast surgery     No family history on file. History  Substance Use Topics  . Smoking status: Never Smoker   . Smokeless tobacco: Never Used  . Alcohol Use: No     Comment: occasionally   OB History   Grav Para Term Preterm Abortions TAB SAB Ect Mult Living                 Review of Systems  Neurological: Positive for headaches.  All other systems reviewed and are negative.    Allergies  Codeine; Oxycontin; and Rizatriptan  Home Medications   Current Outpatient Rx  Name  Route  Sig   Dispense  Refill  . lamoTRIgine (LAMICTAL) 200 MG tablet   Oral   Take 300 mg by mouth daily.         . metoCLOPramide (REGLAN) 10 MG tablet   Oral   Take 10 mg by mouth 4 (four) times daily.         . norethindrone-ethinyl estradiol-iron (MICROGESTIN FE,GILDESS FE,LOESTRIN FE) 1.5-30 MG-MCG tablet   Oral   Take 1 tablet by mouth daily.         . promethazine (PHENERGAN) 25 MG tablet   Oral   Take 25 mg by mouth every 6 (six) hours as needed.         . topiramate (TOPAMAX) 50 MG tablet   Oral   Take 50 mg by mouth 2 (two) times daily.           BP 117/62  Pulse 74  Temp(Src) 98.4 F (36.9 C) (Oral)  Resp 17  Ht 5\' 2"  (1.575 m)  Wt 108 lb (48.988 kg)  BMI 19.75 kg/m2  SpO2 100%  LMP 05/31/2013 Physical Exam  Nursing note and vitals reviewed. Constitutional: She appears well-developed and well-nourished.  HENT:  Head: Normocephalic and atraumatic.  Mouth/Throat: Oropharynx is clear and moist. No oropharyngeal exudate.  No nasal discharge, no sinus tenderness, tympanic membranes are clear, oropharynx is clear and moist.  Eyes: Conjunctivae and EOM are normal. Pupils  are equal, round, and reactive to light. Right eye exhibits no discharge. Left eye exhibits no discharge. No scleral icterus.  Neck: Normal range of motion. Neck supple. No JVD present. No thyromegaly present.  Cardiovascular: Normal rate, regular rhythm, normal heart sounds and intact distal pulses.  Exam reveals no gallop and no friction rub.   No murmur heard. Pulmonary/Chest: Effort normal and breath sounds normal. No respiratory distress. She has no wheezes. She has no rales.  Abdominal: Soft. Bowel sounds are normal. She exhibits no distension and no mass. There is no tenderness.  Musculoskeletal: Normal range of motion. She exhibits no edema and no tenderness.  Lymphadenopathy:    She has no cervical adenopathy.  Neurological: She is alert. Coordination normal.  Normal strength and sensation  in all 4 extremities, normal movement, normal gait, normal speech, cranial nerves III through XII are normal  Skin: Skin is warm and dry. No rash noted. No erythema.  Psychiatric: She has a normal mood and affect. Her behavior is normal.  Tearful    ED Course  Procedures (including critical care time) Labs Review Labs Reviewed - No data to display Imaging Review No results found.  MDM   1. Headache    The patient has normal vital signs, no fever, she has a very supple neck but she does have tenderness along the strap muscles and the trapezius muscle on the right. She has normal neurologic function, no weakness or numbness or discoordination. She'll be treated aggressively with pain medications and IV fluids, reevaluation.  Pt now 0/10 pain states ready for d/c.  VS normal, ' Meds given in ED:  Medications  sodium chloride 0.9 % bolus 1,000 mL (0 mLs Intravenous Stopped 06/21/13 1316)  ketorolac (TORADOL) 30 MG/ML injection 30 mg (30 mg Intravenous Given 06/21/13 1206)  metoCLOPramide (REGLAN) injection 10 mg (10 mg Intravenous Given 06/21/13 1206)  diphenhydrAMINE (BENADRYL) injection 25 mg (25 mg Intravenous Given 06/21/13 1206)  sodium chloride 0.9 % bolus 1,000 mL (1,000 mLs Intravenous New Bag/Given 06/21/13 1200)    New Prescriptions   No medications on file    Declines rx for home.  Vida Roller, MD 06/21/13 1355

## 2013-06-21 NOTE — ED Notes (Signed)
Pt reports a headache that started yesterday unrelieved after taking OTC medication and Reglan last night.

## 2013-06-21 NOTE — ED Notes (Signed)
MD at bedside. 

## 2013-08-26 ENCOUNTER — Emergency Department (HOSPITAL_BASED_OUTPATIENT_CLINIC_OR_DEPARTMENT_OTHER): Payer: Medicaid Other

## 2013-08-26 ENCOUNTER — Encounter (HOSPITAL_BASED_OUTPATIENT_CLINIC_OR_DEPARTMENT_OTHER): Payer: Self-pay | Admitting: Emergency Medicine

## 2013-08-26 ENCOUNTER — Emergency Department (HOSPITAL_BASED_OUTPATIENT_CLINIC_OR_DEPARTMENT_OTHER)
Admission: EM | Admit: 2013-08-26 | Discharge: 2013-08-26 | Disposition: A | Discharge status: Home / Self Care | Payer: Medicaid Other | Attending: Emergency Medicine | Admitting: Emergency Medicine

## 2013-08-26 DIAGNOSIS — Z3202 Encounter for pregnancy test, result negative: Secondary | ICD-10-CM | POA: Insufficient documentation

## 2013-08-26 DIAGNOSIS — G40909 Epilepsy, unspecified, not intractable, without status epilepticus: Secondary | ICD-10-CM | POA: Insufficient documentation

## 2013-08-26 DIAGNOSIS — Z79899 Other long term (current) drug therapy: Secondary | ICD-10-CM | POA: Insufficient documentation

## 2013-08-26 DIAGNOSIS — G43909 Migraine, unspecified, not intractable, without status migrainosus: Secondary | ICD-10-CM

## 2013-08-26 MED ORDER — SODIUM CHLORIDE 0.9 % IV BOLUS (SEPSIS)
1000.0000 mL | Freq: Once | INTRAVENOUS | Status: AC
  Administered 2013-08-26: 1000 mL via INTRAVENOUS

## 2013-08-26 MED ORDER — KETOROLAC TROMETHAMINE 30 MG/ML IJ SOLN
30.0000 mg | Freq: Once | INTRAMUSCULAR | Status: AC
  Administered 2013-08-26: 30 mg via INTRAVENOUS
  Filled 2013-08-26: qty 1

## 2013-08-26 MED ORDER — METOCLOPRAMIDE HCL 10 MG PO TABS: 10.0000 mg | ORAL_TABLET | Freq: Four times a day (QID) | ORAL | Status: DC | PRN

## 2013-08-26 MED ORDER — METOCLOPRAMIDE HCL 5 MG/ML IJ SOLN
10.0000 mg | Freq: Once | INTRAMUSCULAR | Status: AC
  Administered 2013-08-26: 10 mg via INTRAVENOUS
  Filled 2013-08-26: qty 2

## 2013-08-26 MED ORDER — PROMETHAZINE HCL 25 MG/ML IJ SOLN
25.0000 mg | Freq: Once | INTRAMUSCULAR | Status: AC
  Administered 2013-08-26: 25 mg via INTRAVENOUS
  Filled 2013-08-26: qty 1

## 2013-08-26 MED ORDER — DIPHENHYDRAMINE HCL 50 MG/ML IJ SOLN
25.0000 mg | Freq: Once | INTRAMUSCULAR | Status: AC
  Administered 2013-08-26: 25 mg via INTRAVENOUS
  Filled 2013-08-26: qty 1

## 2013-08-26 NOTE — ED Notes (Signed)
C/o migraine headache x 4 days with nausea

## 2013-08-26 NOTE — ED Provider Notes (Signed)
CSN: [id number]     Arrival date & time 08/26/13  0013 History   First MD Initiated Contact with Patient 08/26/13 0032     Chief Complaint  Patient presents with  . Migraine   (Consider location/radiation/quality/duration/timing/severity/associated sxs/prior Treatment) Patient is a 22 y.o. female presenting with migraines. The history is provided by the patient and a parent.  Migraine This is a recurrent problem. The current episode started more than 2 days ago. The problem occurs constantly. The problem has not changed since onset.Pertinent negatives include no chest pain, no abdominal pain and no shortness of breath. Nothing aggravates the symptoms. Nothing relieves the symptoms. She has tried acetaminophen for the symptoms. The treatment provided no relief.    Past Medical History  Diagnosis Date  . Migraine   . 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 Polymicrogyria    Past Surgical History  Procedure Laterality Date  . Foot surgery    . Breast surgery     History reviewed. No pertinent family history. History  Substance Use Topics  . Smoking status: Never Smoker   . Smokeless tobacco: Never Used  . Alcohol Use: No     Comment: occasionally   OB History   Grav Para Term Preterm Abortions TAB SAB Ect Mult Living                 Review of Systems  Constitutional: Negative for fever.  Eyes: Negative for photophobia.  Respiratory: Negative for shortness of breath.   Cardiovascular: Negative for chest pain.  Gastrointestinal: Negative for abdominal pain.  Neurological: Negative for dizziness, facial asymmetry, speech difficulty, weakness, light-headedness and numbness.  All other systems reviewed and are negative.    Allergies  Codeine; Oxycontin; and Rizatriptan  Home Medications   Current Outpatient Rx  Name  Route  Sig  Dispense  Refill  . lamoTRIgine (LAMICTAL) 200 MG tablet   Oral   Take 300 mg by mouth daily.         . metoCLOPramide (REGLAN) 10 MG  tablet   Oral   Take 10 mg by mouth 4 (four) times daily.         . norethindrone-ethinyl estradiol-iron (MICROGESTIN FE,GILDESS FE,LOESTRIN FE) 1.5-30 MG-MCG tablet   Oral   Take 1 tablet by mouth daily.         . promethazine (PHENERGAN) 25 MG tablet   Oral   Take 25 mg by mouth every 6 (six) hours as needed.         . topiramate (TOPAMAX) 50 MG tablet   Oral   Take 50 mg by mouth 2 (two) times daily.           BP 119/86  Pulse 84  Temp(Src) 98.6 F (37 C) (Oral)  Resp 16  SpO2 98%  LMP 08/24/2013 Physical Exam  Constitutional: She is oriented to person, place, and time. She appears well-developed and well-nourished. No distress.  HENT:  Head: Normocephalic and atraumatic.  Mouth/Throat: Oropharynx is clear and moist.  Eyes: Conjunctivae and EOM are normal. Pupils are equal, round, and reactive to light.  Neck: Normal range of motion. Neck supple.  Cardiovascular: Normal rate, regular rhythm and intact distal pulses.   Pulmonary/Chest: Effort normal and breath sounds normal. She has no wheezes. She has no rales.  Abdominal: Soft. Bowel sounds are normal. There is no tenderness. There is no rebound and no guarding.  Musculoskeletal: Normal range of motion.  Neurological: She is alert and  oriented to person, place, and time. She has normal reflexes. No cranial nerve deficit.  Skin: Skin is warm and dry.  Psychiatric: She has a normal mood and affect.    ED Course  Procedures (including critical care time) Labs Review Labs Reviewed  PREGNANCY, URINE   Imaging Review Ct Head Wo Contrast  08/26/2013   CLINICAL DATA:  Left-sided headache for 4 days.  EXAM: CT HEAD WITHOUT CONTRAST  TECHNIQUE: Contiguous axial images were obtained from the base of the skull through the vertex without intravenous contrast.  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 No acute intracranial hemorrhage or infarct. No mass or midline shift. Ventricular size is stable without evidence of hydrocephalus. No extra-axial fluid collection. Gray-white matter differentiation is maintained.  Calvarium is intact. Orbits are normal.  Visualized paranasal sinuses and mastoid air cells are clear.  IMPRESSION: 1. No acute intracranial process 2. 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   Electronically Signed   By: Rise Mu M.D.   On: 08/26/2013 02:28    EKG Interpretation   None       MDM  No diagnosis found. Patient with recurrent migraines.  Exam benign.  Patient resting comfortably following migraine cocktail .  Will d/c home.  To be seen at [facility] neurology today   Royce Sciara Smitty Cords, MD 08/26/13 [phone]

## 2013-08-26 NOTE — ED Notes (Signed)
Pt has taken ibuprofen, goody's, benadryl and reglan as prescribed by neurologist

## 2013-09-13 ENCOUNTER — Encounter (HOSPITAL_BASED_OUTPATIENT_CLINIC_OR_DEPARTMENT_OTHER): Payer: Self-pay | Admitting: Emergency Medicine

## 2013-09-13 ENCOUNTER — Emergency Department (HOSPITAL_BASED_OUTPATIENT_CLINIC_OR_DEPARTMENT_OTHER): Payer: Medicaid Other

## 2013-09-13 ENCOUNTER — Emergency Department (HOSPITAL_BASED_OUTPATIENT_CLINIC_OR_DEPARTMENT_OTHER)
Admission: EM | Admit: 2013-09-13 | Discharge: 2013-09-13 | Disposition: A | Discharge status: Home / Self Care | Payer: Medicaid Other | Attending: Emergency Medicine | Admitting: Emergency Medicine

## 2013-09-13 DIAGNOSIS — G40909 Epilepsy, unspecified, not intractable, without status epilepticus: Secondary | ICD-10-CM | POA: Insufficient documentation

## 2013-09-13 DIAGNOSIS — G43909 Migraine, unspecified, not intractable, without status migrainosus: Secondary | ICD-10-CM | POA: Insufficient documentation

## 2013-09-13 DIAGNOSIS — R071 Chest pain on breathing: Secondary | ICD-10-CM | POA: Insufficient documentation

## 2013-09-13 DIAGNOSIS — R0789 Other chest pain: Secondary | ICD-10-CM

## 2013-09-13 DIAGNOSIS — Z79899 Other long term (current) drug therapy: Secondary | ICD-10-CM | POA: Insufficient documentation

## 2013-09-13 MED ORDER — DIPHENHYDRAMINE HCL 50 MG/ML IJ SOLN
25.0000 mg | Freq: Once | INTRAMUSCULAR | Status: AC
  Administered 2013-09-13: 25 mg via INTRAVENOUS
  Filled 2013-09-13: qty 1

## 2013-09-13 MED ORDER — SODIUM CHLORIDE 0.9 % IV BOLUS (SEPSIS)
1000.0000 mL | Freq: Once | INTRAVENOUS | Status: AC
  Administered 2013-09-13: 1000 mL via INTRAVENOUS

## 2013-09-13 MED ORDER — METOCLOPRAMIDE HCL 5 MG/ML IJ SOLN
10.0000 mg | Freq: Once | INTRAMUSCULAR | Status: AC
  Administered 2013-09-13: 10 mg via INTRAVENOUS
  Filled 2013-09-13: qty 2

## 2013-09-13 MED ORDER — DEXAMETHASONE SODIUM PHOSPHATE 10 MG/ML IJ SOLN
10.0000 mg | Freq: Once | INTRAMUSCULAR | Status: AC
  Administered 2013-09-13: 10 mg via INTRAVENOUS
  Filled 2013-09-13: qty 1

## 2013-09-13 NOTE — ED Provider Notes (Signed)
CSN: [id number]     Arrival date & time 09/13/13  1035 History   First MD Initiated Contact with Patient 09/13/13 1059     Chief Complaint  Patient presents with  . Migraine   (Consider location/radiation/quality/duration/timing/severity/associated sxs/prior Treatment) Patient is a 22 y.o. female presenting with migraines.  Migraine   Pt with frequent migraines seen by Neuro at [facility] about 2 weeks ago and given steroid taper as well as starting propranolol which has not helped much and caused increased side effects. She reports 2 days of severe throbbing R sided headache, similar to previous.   She also reports intermittent sharp R sided chest pain radiating into her back since yesterday. No SOB, cough or fever. Worse with deep breath.  Past Medical History  Diagnosis Date  . Migraine   . 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 Polymicrogyria    Past Surgical History  Procedure Laterality Date  . Foot surgery    . Breast surgery     History reviewed. No pertinent family history. History  Substance Use Topics  . Smoking status: Never Smoker   . Smokeless tobacco: Never Used  . Alcohol Use: No     Comment: occasionally   OB History   Grav Para Term Preterm Abortions TAB SAB Ect Mult Living                 Review of Systems All other systems reviewed and are negative except as noted in HPI.    Allergies  Codeine; Oxycontin; and Rizatriptan  Home Medications   Current Outpatient Rx  Name  Route  Sig  Dispense  Refill  . lamoTRIgine (LAMICTAL) 200 MG tablet   Oral   Take 300 mg by mouth daily.         . metoCLOPramide (REGLAN) 10 MG tablet   Oral   Take 10 mg by mouth 4 (four) times daily.         . metoCLOPramide (REGLAN) 10 MG tablet   Oral   Take 1 tablet (10 mg total) by mouth every 6 (six) hours as needed (nausea/headache).   6 tablet   0   . norethindrone-ethinyl estradiol-iron (MICROGESTIN FE,GILDESS FE,LOESTRIN FE) 1.5-30 MG-MCG tablet  Oral   Take 1 tablet by mouth daily.         . promethazine (PHENERGAN) 25 MG tablet   Oral   Take 25 mg by mouth every 6 (six) hours as needed.         . topiramate (TOPAMAX) 50 MG tablet   Oral   Take 50 mg by mouth 2 (two) times daily.           BP 116/83  Pulse 82  Temp(Src) 98.5 F (36.9 C) (Oral)  Resp 16  Ht 5\' 2"  (1.575 m)  Wt 110 lb (49.896 kg)  BMI 20.11 kg/m2  SpO2 99%  LMP 08/24/2013 Physical Exam  Nursing note and vitals reviewed. Constitutional: She is oriented to person, place, and time. She appears well-developed and well-nourished.  HENT:  Head: Normocephalic and atraumatic.  Eyes: EOM are normal. Pupils are equal, round, and reactive to light.  Neck: Normal range of motion. Neck supple.  Cardiovascular: Normal rate, normal heart sounds and intact distal pulses.   Pulmonary/Chest: Effort normal and breath sounds normal. She exhibits tenderness (R chest wall).  Abdominal: Bowel sounds are normal. She exhibits no distension. There is no tenderness.  Musculoskeletal: Normal range of motion. She exhibits no edema  and no tenderness.  Neurological: She is alert and oriented to person, place, and time. She has normal strength. No cranial nerve deficit or sensory deficit.  Skin: Skin is warm and dry. No rash noted.  Psychiatric: She has a normal mood and affect.    ED Course  Procedures (including critical care time) Labs Review Labs Reviewed - No data to display Imaging Review Dg Chest 2 View  09/13/2013   CLINICAL DATA:  Right-sided chest pain.  EXAM: CHEST  2 VIEW  COMPARISON:  None.  FINDINGS: The heart size and mediastinal contours are within normal limits. Both lungs are clear. The visualized skeletal structures are unremarkable.  IMPRESSION: No active cardiopulmonary disease.   Electronically Signed   By: Elige Ko   On: 09/13/2013 11:18    EKG Interpretation    Date/Time:  Tuesday September 13 2013 11:27:38 EST Ventricular Rate:  69 PR  Interval:  134 QRS Duration: 72 QT Interval:  404 QTC Calculation: 432 R Axis:   14 Text Interpretation:  Normal sinus rhythm Normal ECG No old tracing to compare Confirmed by [facility]  MD, Bernard Slayden [phone]) on 09/13/2013 11:34:58 AM            MDM   1. Migraine   2. Chest wall pain     CXR and EKG normal. Doubt ACS or PE, she is PERC neg. Headache improved with decadron, reglan, benadryl and 500cc IVF. She is ready to go home.     Nayib Remer B. Bernette Mayers, MD 09/13/13 1209

## 2013-09-13 NOTE — ED Notes (Signed)
Severe headache has paper with neurologists recommendations with her also reports sharp right side chest pain

## 2013-09-14 ENCOUNTER — Ambulatory Visit: Payer: Medicaid Other | Attending: Family Medicine | Admitting: Physical Therapy

## 2013-09-14 DIAGNOSIS — R5381 Other malaise: Secondary | ICD-10-CM | POA: Insufficient documentation

## 2013-09-14 DIAGNOSIS — IMO0001 Reserved for inherently not codable concepts without codable children: Secondary | ICD-10-CM | POA: Insufficient documentation

## 2013-09-14 DIAGNOSIS — M6281 Muscle weakness (generalized): Secondary | ICD-10-CM | POA: Insufficient documentation

## 2013-10-04 ENCOUNTER — Ambulatory Visit: Payer: Medicaid Other | Admitting: Physical Therapy

## 2013-10-05 ENCOUNTER — Ambulatory Visit: Payer: Medicaid Other | Attending: Family Medicine | Admitting: Physical Therapy

## 2013-10-05 DIAGNOSIS — R5381 Other malaise: Secondary | ICD-10-CM | POA: Insufficient documentation

## 2013-10-05 DIAGNOSIS — M6281 Muscle weakness (generalized): Secondary | ICD-10-CM | POA: Insufficient documentation

## 2013-10-05 DIAGNOSIS — IMO0001 Reserved for inherently not codable concepts without codable children: Secondary | ICD-10-CM | POA: Insufficient documentation

## 2013-10-11 ENCOUNTER — Ambulatory Visit: Payer: Medicaid Other | Admitting: Physical Therapy

## 2013-11-19 ENCOUNTER — Emergency Department (HOSPITAL_BASED_OUTPATIENT_CLINIC_OR_DEPARTMENT_OTHER)
Admission: EM | Admit: 2013-11-19 | Discharge: 2013-11-19 | Disposition: A | Discharge status: Home / Self Care | Payer: Medicaid Other | Attending: Emergency Medicine | Admitting: Emergency Medicine

## 2013-11-19 ENCOUNTER — Encounter (HOSPITAL_BASED_OUTPATIENT_CLINIC_OR_DEPARTMENT_OTHER): Payer: Self-pay | Admitting: Emergency Medicine

## 2013-11-19 DIAGNOSIS — Z79899 Other long term (current) drug therapy: Secondary | ICD-10-CM | POA: Insufficient documentation

## 2013-11-19 DIAGNOSIS — G40909 Epilepsy, unspecified, not intractable, without status epilepticus: Secondary | ICD-10-CM | POA: Insufficient documentation

## 2013-11-19 DIAGNOSIS — G43909 Migraine, unspecified, not intractable, without status migrainosus: Secondary | ICD-10-CM | POA: Insufficient documentation

## 2013-11-19 MED ORDER — SODIUM CHLORIDE 0.9 % IV BOLUS (SEPSIS)
1000.0000 mL | Freq: Once | INTRAVENOUS | Status: AC
  Administered 2013-11-19: 1000 mL via INTRAVENOUS

## 2013-11-19 MED ORDER — METOCLOPRAMIDE HCL 5 MG/ML IJ SOLN
10.0000 mg | Freq: Once | INTRAMUSCULAR | Status: AC
  Administered 2013-11-19: 10 mg via INTRAVENOUS
  Filled 2013-11-19: qty 2

## 2013-11-19 MED ORDER — DEXAMETHASONE SODIUM PHOSPHATE 10 MG/ML IJ SOLN
10.0000 mg | Freq: Once | INTRAMUSCULAR | Status: AC
  Administered 2013-11-19: 10 mg via INTRAVENOUS
  Filled 2013-11-19: qty 1

## 2013-11-19 NOTE — Discharge Instructions (Signed)
Migraine Headache A migraine headache is an intense, throbbing pain on one or both sides of your head. A migraine can last for 30 minutes to several hours. CAUSES  The exact cause of a migraine headache is not always known. However, a migraine may be caused when nerves in the brain become irritated and release chemicals that cause inflammation. This causes pain. Certain things may also trigger migraines, such as:  Alcohol.  Smoking.  Stress.  Menstruation.  Aged cheeses.  Foods or drinks that contain nitrates, glutamate, aspartame, or tyramine.  Lack of sleep.  Chocolate.  Caffeine.  Hunger.  Physical exertion.  Fatigue.  Medicines used to treat chest pain (nitroglycerine), birth control pills, estrogen, and some blood pressure medicines. SIGNS AND SYMPTOMS  Pain on one or both sides of your head.  Pulsating or throbbing pain.  Severe pain that prevents daily activities.  Pain that is aggravated by any physical activity.  Nausea, vomiting, or both.  Dizziness.  Pain with exposure to bright lights, loud noises, or activity.  General sensitivity to bright lights, loud noises, or smells. Before you get a migraine, you may get warning signs that a migraine is coming (aura). An aura may include:  Seeing flashing lights.  Seeing bright spots, halos, or zig-zag lines.  Having tunnel vision or blurred vision.  Having feelings of numbness or tingling.  Having trouble talking.  Having muscle weakness. DIAGNOSIS  A migraine headache is often diagnosed based on:  Symptoms.  Physical exam.  A CT scan or MRI of your head. These imaging tests cannot diagnose migraines, but they can help rule out other causes of headaches. TREATMENT Medicines may be given for pain and nausea. Medicines can also be given to help prevent recurrent migraines.  HOME CARE INSTRUCTIONS  Only take over-the-counter or prescription medicines for pain or discomfort as directed by your  health care provider. The use of long-term narcotics is not recommended.  Lie down in a dark, quiet room when you have a migraine.  Keep a journal to find out what may trigger your migraine headaches. For example, write down:  What you eat and drink.  How much sleep you get.  Any change to your diet or medicines.  Limit alcohol consumption.  Quit smoking if you smoke.  Get 7 9 hours of sleep, or as recommended by your health care provider.  Limit stress.  Keep lights dim if bright lights bother you and make your migraines worse. SEEK IMMEDIATE MEDICAL CARE IF:   Your migraine becomes severe.  You have a fever.  You have a stiff neck.  You have vision loss.  You have muscular weakness or loss of muscle control.  You start losing your balance or have trouble walking.  You feel faint or pass out.  You have severe symptoms that are different from your first symptoms. MAKE SURE YOU:   Understand these instructions.  Will watch your condition.  Will get help right away if you are not doing well or get worse. Document Released: 10/13/2005 Document Revised: 08/03/2013 Document Reviewed: 06/20/2013 [employer] Patient Information 2014 [employer].  

## 2013-11-19 NOTE — ED Provider Notes (Signed)
CSN: [id number]     Arrival date & time 11/19/13  [id number] History   First MD Initiated Contact with Patient 11/19/13 [phone]     Chief Complaint  Patient presents with  . Headache   (Consider location/radiation/quality/duration/timing/severity/associated sxs/prior Treatment) Patient is a 23 y.o. female presenting with headaches.  Headache  Pt with history of recurrent migraines, [facility] reports about a week of URI symptoms and now with severe throbbing posterior headache radiating to L face, associated with facial swelling, typical for her migraines.   Past Medical History  Diagnosis Date  . Migraine   . 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 Polymicrogyria    Past Surgical History  Procedure Laterality Date  . Foot surgery    . Breast surgery     No family history on file. History  Substance Use Topics  . Smoking status: Never Smoker   . Smokeless tobacco: Never Used  . Alcohol Use: No     Comment: occasionally   OB History   Grav Para Term Preterm Abortions TAB SAB Ect Mult Living                 Review of Systems  Neurological: Positive for headaches.   All other systems reviewed and are negative except as noted in HPI.   Allergies  Codeine; Oxycontin; and Rizatriptan  Home Medications   Current Outpatient Rx  Name  Route  Sig  Dispense  Refill  . propantheline (PROBANTHINE) 15 MG tablet   Oral   Take 10 mg by mouth 3 (three) times daily with meals.         . lamoTRIgine (LAMICTAL) 200 MG tablet   Oral   Take 300 mg by mouth daily.         . metoCLOPramide (REGLAN) 10 MG tablet   Oral   Take 10 mg by mouth 4 (four) times daily.         . metoCLOPramide (REGLAN) 10 MG tablet   Oral   Take 1 tablet (10 mg total) by mouth every 6 (six) hours as needed (nausea/headache).   6 tablet   0   . norethindrone-ethinyl estradiol-iron (MICROGESTIN FE,GILDESS FE,LOESTRIN FE) 1.5-30 MG-MCG tablet   Oral   Take 1 tablet by mouth daily.          . promethazine (PHENERGAN) 25 MG tablet   Oral   Take 25 mg by mouth every 6 (six) hours as needed.          BP 130/78  Pulse 80  Temp(Src) 98.3 F (36.8 C)  Resp 16  SpO2 99% Physical Exam  Nursing note and vitals reviewed. Constitutional: She is oriented to person, place, and time. She appears well-developed and well-nourished.  HENT:  Head: Normocephalic and atraumatic.  Eyes: EOM are normal. Pupils are equal, round, and reactive to light.  Neck: Normal range of motion. Neck supple.  Cardiovascular: Normal rate, normal heart sounds and intact distal pulses.   Pulmonary/Chest: Effort normal and breath sounds normal.  Abdominal: Bowel sounds are normal. She exhibits no distension. There is no tenderness.  Musculoskeletal: Normal range of motion. She exhibits no edema and no tenderness.  Neurological: She is alert and oriented to person, place, and time. She has normal strength. No cranial nerve deficit or sensory deficit.  Skin: Skin is warm and dry. No rash noted.  Psychiatric: She has a normal mood and affect.    ED Course  Procedures (including  critical care time) Labs Review Labs Reviewed - No data to display Imaging Review No results found.  EKG Interpretation   None       MDM   1. Migraine     Symptoms relieved with Decadron/Reglan, pt feeling better, ready to go home.     Jadon Harbaugh B. Bernette Mayers, MD 11/19/13 [phone]

## 2013-11-19 NOTE — ED Notes (Signed)
Patient here with occipital headache since last pm. Reports frequent headaches. No nausea, no photophobia. Alert and oriented

## 2013-12-12 ENCOUNTER — Other Ambulatory Visit: Payer: Self-pay | Admitting: Family Medicine

## 2013-12-12 ENCOUNTER — Other Ambulatory Visit (HOSPITAL_COMMUNITY)
Admission: RE | Admit: 2013-12-12 | Discharge: 2013-12-12 | Disposition: A | Discharge status: Home / Self Care | Payer: Medicaid Other | Source: Ambulatory Visit | Attending: Family Medicine | Admitting: Family Medicine

## 2013-12-12 DIAGNOSIS — Z124 Encounter for screening for malignant neoplasm of cervix: Secondary | ICD-10-CM | POA: Insufficient documentation

## 2013-12-12 DIAGNOSIS — Z113 Encounter for screening for infections with a predominantly sexual mode of transmission: Secondary | ICD-10-CM | POA: Insufficient documentation

## 2013-12-24 ENCOUNTER — Emergency Department (HOSPITAL_BASED_OUTPATIENT_CLINIC_OR_DEPARTMENT_OTHER)
Admission: EM | Admit: 2013-12-24 | Discharge: 2013-12-24 | Disposition: A | Discharge status: Home / Self Care | Payer: Medicaid Other | Attending: Emergency Medicine | Admitting: Emergency Medicine

## 2013-12-24 ENCOUNTER — Encounter (HOSPITAL_BASED_OUTPATIENT_CLINIC_OR_DEPARTMENT_OTHER): Payer: Self-pay | Admitting: Emergency Medicine

## 2013-12-24 DIAGNOSIS — R519 Headache, unspecified: Secondary | ICD-10-CM

## 2013-12-24 DIAGNOSIS — Z79899 Other long term (current) drug therapy: Secondary | ICD-10-CM | POA: Insufficient documentation

## 2013-12-24 DIAGNOSIS — R112 Nausea with vomiting, unspecified: Secondary | ICD-10-CM | POA: Insufficient documentation

## 2013-12-24 DIAGNOSIS — R51 Headache: Secondary | ICD-10-CM | POA: Insufficient documentation

## 2013-12-24 DIAGNOSIS — Z8679 Personal history of other diseases of the circulatory system: Secondary | ICD-10-CM | POA: Insufficient documentation

## 2013-12-24 DIAGNOSIS — Z8669 Personal history of other diseases of the nervous system and sense organs: Secondary | ICD-10-CM | POA: Insufficient documentation

## 2013-12-24 DIAGNOSIS — Q043 Other reduction deformities of brain: Secondary | ICD-10-CM | POA: Insufficient documentation

## 2013-12-24 MED ORDER — METOCLOPRAMIDE HCL 5 MG/ML IJ SOLN
10.0000 mg | Freq: Once | INTRAMUSCULAR | Status: AC
  Administered 2013-12-24: 10 mg via INTRAVENOUS
  Filled 2013-12-24: qty 2

## 2013-12-24 MED ORDER — SODIUM CHLORIDE 0.9 % IV BOLUS (SEPSIS)
1000.0000 mL | Freq: Once | INTRAVENOUS | Status: DC
Start: 2013-12-24 — End: 2013-12-24

## 2013-12-24 MED ORDER — DEXAMETHASONE SODIUM PHOSPHATE 10 MG/ML IJ SOLN
10.0000 mg | Freq: Once | INTRAMUSCULAR | Status: AC
  Administered 2013-12-24: 10 mg via INTRAVENOUS
  Filled 2013-12-24: qty 1

## 2013-12-24 MED ORDER — SODIUM CHLORIDE 0.9 % IV BOLUS (SEPSIS)
1000.0000 mL | Freq: Once | INTRAVENOUS | Status: AC
  Administered 2013-12-24: 1000 mL via INTRAVENOUS

## 2013-12-24 NOTE — ED Notes (Signed)
D/ home with family

## 2013-12-24 NOTE — ED Notes (Signed)
MD at bedside. 

## 2013-12-24 NOTE — ED Notes (Signed)
History of headache-this one has lasted three days.  States cluster headache.  Denies nausea, vomiting.  Eyes are sensitive to outdoor light.  Taking Excedrin, Tylenol, and Benadryl at home.  Denies fever.

## 2013-12-24 NOTE — ED Provider Notes (Signed)
CSN: [id number]     Arrival date & time 12/24/13  1642 History  This chart was scribed for Rolland Porter, MD by Smiley Houseman, ED Scribe. The patient was seen in room MH10/MH10. Patient's care was started at 5:08 PM.    Chief Complaint  Patient presents with  . Headache   The history is provided by the patient. No language interpreter was used.   HPI Comments: AVONELLE VIVEROS is a 23 y.o. female who presents to the Emergency Department complaining of a intermittent worsening HA that started about 3 days ago.  Mother states pt was taking medication for her HA, but stopped because she gained about 20 lbs in about a month.  Pt states her HA's have improved since she stopped taking her medication.  Pt takes Reglan for nausea.  Pt states she has tried home cock tails without relief, including Excedrin, Tylenol, and Benadryl.  She states after she has decadron, her HA will subside for about 3 weeks.  Pt states for the past couple of weeks she has had a sinus infection and other medical conditions, which has worsened her HAs.  Pt states she is experiencing nausea currently.  Pt states she had 1 episode of emesis yesterday, but denies episodes today.  She reports she doesn't like taking Benadryl, because of the way it makes her feel.    Past Medical History  Diagnosis Date  . Migraine   . 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 Polymicrogyria    Past Surgical History  Procedure Laterality Date  . Foot surgery    . Breast surgery     No family history on file. History  Substance Use Topics  . Smoking status: Never Smoker   . Smokeless tobacco: Never Used  . Alcohol Use: No     Comment: occasionally   OB History   Grav Para Term Preterm Abortions TAB SAB Ect Mult Living                 Review of Systems  Constitutional: Negative for fever, chills, diaphoresis, appetite change and fatigue.  HENT: Negative for mouth sores, sore throat and trouble swallowing.   Eyes: Negative for visual  disturbance.  Respiratory: Negative for cough, chest tightness, shortness of breath and wheezing.   Cardiovascular: Negative for chest pain.  Gastrointestinal: Positive for nausea and vomiting (1 episode). Negative for abdominal pain, diarrhea and abdominal distention.  Endocrine: Negative for polydipsia, polyphagia and polyuria.  Genitourinary: Negative for dysuria, frequency and hematuria.  Musculoskeletal: Negative for gait problem.  Skin: Negative for color change, pallor and rash.  Neurological: Positive for headaches. Negative for dizziness, syncope and light-headedness.  Hematological: Does not bruise/bleed easily.  Psychiatric/Behavioral: Negative for behavioral problems and confusion.  All other systems reviewed and are negative.      Allergies  Codeine; Oxycontin; and Rizatriptan  Home Medications   Current Outpatient Rx  Name  Route  Sig  Dispense  Refill  . lamoTRIgine (LAMICTAL) 200 MG tablet   Oral   Take 300 mg by mouth daily.         . metoCLOPramide (REGLAN) 10 MG tablet   Oral   Take 10 mg by mouth 4 (four) times daily.         . metoCLOPramide (REGLAN) 10 MG tablet   Oral   Take 1 tablet (10 mg total) by mouth every 6 (six) hours as needed (nausea/headache).   6 tablet   0   .  norethindrone-ethinyl estradiol-iron (MICROGESTIN FE,GILDESS FE,LOESTRIN FE) 1.5-30 MG-MCG tablet   Oral   Take 1 tablet by mouth daily.         . promethazine (PHENERGAN) 25 MG tablet   Oral   Take 25 mg by mouth every 6 (six) hours as needed.         . propantheline (PROBANTHINE) 15 MG tablet   Oral   Take 10 mg by mouth 3 (three) times daily with meals.          Triage Vitals: BP 112/74  Pulse 97  Temp(Src) 98.3 F (36.8 C) (Oral)  Resp 20  Ht 5\' 2"  (1.575 m)  Wt 112 lb (50.803 kg)  BMI 20.48 kg/m2  SpO2 98%  LMP 12/16/2013  Physical Exam  Nursing note and vitals reviewed. Constitutional: She is oriented to person, place, and time. She appears  well-developed and well-nourished. No distress.  HENT:  Head: Normocephalic.  Right Ear: External ear normal.  Left Ear: External ear normal.  Nose: Nose normal.  Mouth/Throat: Oropharynx is clear and moist. No oropharyngeal exudate.  Eyes: Conjunctivae and EOM are normal. Pupils are equal, round, and reactive to light. No scleral icterus.  Neck: Normal range of motion. Neck supple. No thyromegaly present.  Cardiovascular: Normal rate and regular rhythm.  Exam reveals no gallop and no friction rub.   No murmur heard. Pulmonary/Chest: Effort normal and breath sounds normal. No respiratory distress. She has no wheezes. She has no rales.  Abdominal: Soft. Bowel sounds are normal. She exhibits no distension. There is no tenderness. There is no rebound.  Musculoskeletal: Normal range of motion.  Neurological: She is alert and oriented to person, place, and time.  Skin: Skin is warm and dry. No rash noted.  Psychiatric: She has a normal mood and affect. Her behavior is normal. Judgment and thought content normal.    ED Course  Procedures (including critical care time) DIAGNOSTIC STUDIES: Oxygen Saturation is 98% on RA, normal by my interpretation.    COORDINATION OF CARE: 5:29 PM-Will order IV fluids and migraine cock tail.  Patient informed of current plan of treatment and evaluation and agrees with plan.     MDM   Final diagnoses:  Headache    After fluids and meds she was sitting on the side of the bed her jacket over her left arm, and across her shoulders. Hitting the nurse call asking for her IV to be removed so she could go. Discharged home. Diagnosis is migraine, resolved.  I personally performed the services described in this documentation, which was scribed in my presence. The recorded information has been reviewed and is accurate.      Rolland PorterMark Michaeal Davis, MD 12/24/13 [phone]

## 2013-12-24 NOTE — Discharge Instructions (Signed)
Migraine Headache A migraine headache is an intense, throbbing pain on one or both sides of your head. A migraine can last for 30 minutes to several hours. CAUSES  The exact cause of a migraine headache is not always known. However, a migraine may be caused when nerves in the brain become irritated and release chemicals that cause inflammation. This causes pain. Certain things may also trigger migraines, such as:  Alcohol.  Smoking.  Stress.  Menstruation.  Aged cheeses.  Foods or drinks that contain nitrates, glutamate, aspartame, or tyramine.  Lack of sleep.  Chocolate.  Caffeine.  Hunger.  Physical exertion.  Fatigue.  Medicines used to treat chest pain (nitroglycerine), birth control pills, estrogen, and some blood pressure medicines. SIGNS AND SYMPTOMS  Pain on one or both sides of your head.  Pulsating or throbbing pain.  Severe pain that prevents daily activities.  Pain that is aggravated by any physical activity.  Nausea, vomiting, or both.  Dizziness.  Pain with exposure to bright lights, loud noises, or activity.  General sensitivity to bright lights, loud noises, or smells. Before you get a migraine, you may get warning signs that a migraine is coming (aura). An aura may include:  Seeing flashing lights.  Seeing bright spots, halos, or zig-zag lines.  Having tunnel vision or blurred vision.  Having feelings of numbness or tingling.  Having trouble talking.  Having muscle weakness. DIAGNOSIS  A migraine headache is often diagnosed based on:  Symptoms.  Physical exam.  A CT scan or MRI of your head. These imaging tests cannot diagnose migraines, but they can help rule out other causes of headaches. TREATMENT Medicines may be given for pain and nausea. Medicines can also be given to help prevent recurrent migraines.  HOME CARE INSTRUCTIONS  Only take over-the-counter or prescription medicines for pain or discomfort as directed by your  health care provider. The use of long-term narcotics is not recommended.  Lie down in a dark, quiet room when you have a migraine.  Keep a journal to find out what may trigger your migraine headaches. For example, write down:  What you eat and drink.  How much sleep you get.  Any change to your diet or medicines.  Limit alcohol consumption.  Quit smoking if you smoke.  Get 7 9 hours of sleep, or as recommended by your health care provider.  Limit stress.  Keep lights dim if bright lights bother you and make your migraines worse. SEEK IMMEDIATE MEDICAL CARE IF:   Your migraine becomes severe.  You have a fever.  You have a stiff neck.  You have vision loss.  You have muscular weakness or loss of muscle control.  You start losing your balance or have trouble walking.  You feel faint or pass out.  You have severe symptoms that are different from your first symptoms. MAKE SURE YOU:   Understand these instructions.  Will watch your condition.  Will get help right away if you are not doing well or get worse. Document Released: 10/13/2005 Document Revised: 08/03/2013 Document Reviewed: 06/20/2013 [employer] Patient Information 2014 [employer].  

## 2014-01-12 ENCOUNTER — Encounter (HOSPITAL_BASED_OUTPATIENT_CLINIC_OR_DEPARTMENT_OTHER): Payer: Self-pay | Admitting: Emergency Medicine

## 2014-01-12 ENCOUNTER — Emergency Department (HOSPITAL_BASED_OUTPATIENT_CLINIC_OR_DEPARTMENT_OTHER)
Admission: EM | Admit: 2014-01-12 | Discharge: 2014-01-12 | Disposition: A | Discharge status: Home / Self Care | Payer: Medicaid Other | Attending: Emergency Medicine | Admitting: Emergency Medicine

## 2014-01-12 DIAGNOSIS — M542 Cervicalgia: Secondary | ICD-10-CM | POA: Insufficient documentation

## 2014-01-12 DIAGNOSIS — G43909 Migraine, unspecified, not intractable, without status migrainosus: Secondary | ICD-10-CM | POA: Insufficient documentation

## 2014-01-12 DIAGNOSIS — Q043 Other reduction deformities of brain: Secondary | ICD-10-CM | POA: Insufficient documentation

## 2014-01-12 DIAGNOSIS — Z79899 Other long term (current) drug therapy: Secondary | ICD-10-CM | POA: Insufficient documentation

## 2014-01-12 DIAGNOSIS — G40909 Epilepsy, unspecified, not intractable, without status epilepticus: Secondary | ICD-10-CM | POA: Insufficient documentation

## 2014-01-12 MED ORDER — METOCLOPRAMIDE HCL 5 MG/ML IJ SOLN
10.0000 mg | Freq: Once | INTRAMUSCULAR | Status: AC
  Administered 2014-01-12: 10 mg via INTRAVENOUS
  Filled 2014-01-12: qty 2

## 2014-01-12 MED ORDER — DIPHENHYDRAMINE HCL 50 MG/ML IJ SOLN
25.0000 mg | Freq: Once | INTRAMUSCULAR | Status: AC
  Administered 2014-01-12: 25 mg via INTRAVENOUS
  Filled 2014-01-12: qty 1

## 2014-01-12 MED ORDER — DEXAMETHASONE SODIUM PHOSPHATE 10 MG/ML IJ SOLN
10.0000 mg | Freq: Once | INTRAMUSCULAR | Status: AC
  Administered 2014-01-12: 10 mg via INTRAVENOUS
  Filled 2014-01-12: qty 1

## 2014-01-12 NOTE — ED Notes (Signed)
History of migraine for couple days. Pt states comes here often for same.

## 2014-01-12 NOTE — ED Provider Notes (Signed)
I saw and evaluated the patient, reviewed the resident's note and I agree with the findings and plan.   EKG Interpretation None      Patient with typical migraine headache. No focal neurological deficit. No imaging necessary, no concern for SAH or infection. Significant improvement with treatment for migraine. Follow up with neurologist.  Gilda Creasehristopher J. Sofya Moustafa, MD 01/12/14 1256

## 2014-01-12 NOTE — Discharge Instructions (Signed)
Migraine Headache A migraine headache is an intense, throbbing pain on one or both sides of your head. A migraine can last for 30 minutes to several hours. CAUSES  The exact cause of a migraine headache is not always known. However, a migraine may be caused when nerves in the brain become irritated and release chemicals that cause inflammation. This causes pain. Certain things may also trigger migraines, such as:  Alcohol.  Smoking.  Stress.  Menstruation.  Aged cheeses.  Foods or drinks that contain nitrates, glutamate, aspartame, or tyramine.  Lack of sleep.  Chocolate.  Caffeine.  Hunger.  Physical exertion.  Fatigue.  Medicines used to treat chest pain (nitroglycerine), birth control pills, estrogen, and some blood pressure medicines. SIGNS AND SYMPTOMS  Pain on one or both sides of your head.  Pulsating or throbbing pain.  Severe pain that prevents daily activities.  Pain that is aggravated by any physical activity.  Nausea, vomiting, or both.  Dizziness.  Pain with exposure to bright lights, loud noises, or activity.  General sensitivity to bright lights, loud noises, or smells. Before you get a migraine, you may get warning signs that a migraine is coming (aura). An aura may include:  Seeing flashing lights.  Seeing bright spots, halos, or zig-zag lines.  Having tunnel vision or blurred vision.  Having feelings of numbness or tingling.  Having trouble talking.  Having muscle weakness. DIAGNOSIS  A migraine headache is often diagnosed based on:  Symptoms.  Physical exam.  A CT scan or MRI of your head. These imaging tests cannot diagnose migraines, but they can help rule out other causes of headaches. TREATMENT Medicines may be given for pain and nausea. Medicines can also be given to help prevent recurrent migraines.  HOME CARE INSTRUCTIONS  Only take over-the-counter or prescription medicines for pain or discomfort as directed by your  health care provider. The use of long-term narcotics is not recommended.  Lie down in a dark, quiet room when you have a migraine.  Keep a journal to find out what may trigger your migraine headaches. For example, write down:  What you eat and drink.  How much sleep you get.  Any change to your diet or medicines.  Limit alcohol consumption.  Quit smoking if you smoke.  Get 7 9 hours of sleep, or as recommended by your health care provider.  Limit stress.  Keep lights dim if bright lights bother you and make your migraines worse. SEEK IMMEDIATE MEDICAL CARE IF:   Your migraine becomes severe.  You have a fever.  You have a stiff neck.  You have vision loss.  You have muscular weakness or loss of muscle control.  You start losing your balance or have trouble walking.  You feel faint or pass out.  You have severe symptoms that are different from your first symptoms. MAKE SURE YOU:   Understand these instructions.  Will watch your condition.  Will get help right away if you are not doing well or get worse. Document Released: 10/13/2005 Document Revised: 08/03/2013 Document Reviewed: 06/20/2013 [employer] Patient Information 2014 [employer].  

## 2014-01-12 NOTE — ED Provider Notes (Signed)
CSN: [id number]     Arrival date & time 01/12/14  0845 History   First MD Initiated Contact with Patient 01/12/14 [phone]     Chief Complaint  Patient presents with  . Migraine   HPI  Ann Hood is 23 y.o. female presented to the ED with headache that started 2 days ago. Patient states that her migraine headache is on the right side behind her ear and  up to her right thigh, with throbbing out of 10 pain. She is followed by [facility] neurology for headaches and seizures. Currently not on any preventative headache medications. She does take daily Lamictal to control her seizures. She gets migraines 1-2 times a week. Patient states she took Reglan and Tylenol last night to help with her headache, with resolution of symptoms. Her pain is worsened by outside natural sunlight. It is improved by rest and medications. She states she normally has to have a migraine cocktail of Reglan, and Decadron, and Benadryl. Currently she has not experienced any nausea, vomiting or visual changes. She denies fever. She is experiencing right-sided back pain and tightness.  Past Medical History  Diagnosis Date  . Migraine   . 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 Polymicrogyria    Past Surgical History  Procedure Laterality Date  . Foot surgery    . Breast surgery     History reviewed. No pertinent family history. History  Substance Use Topics  . Smoking status: Never Smoker   . Smokeless tobacco: Never Used  . Alcohol Use: No     Comment: occasionally   OB History   Grav Para Term Preterm Abortions TAB SAB Ect Mult Living                 Review of Systems  Constitutional: Positive for activity change. Negative for fever, chills, appetite change and fatigue.  HENT: Negative for congestion, ear discharge, ear pain, rhinorrhea, sneezing and sore throat.   Eyes: Positive for pain. Negative for photophobia, discharge, redness, itching and visual disturbance.  Respiratory: Negative for cough  and wheezing.   Cardiovascular: Negative for chest pain.  Gastrointestinal: Negative for nausea, vomiting and diarrhea.  Musculoskeletal: Positive for neck pain.  Neurological: Positive for headaches. Negative for dizziness, seizures, syncope, facial asymmetry, speech difficulty and numbness.    Allergies  Codeine; Oxycontin; and Rizatriptan  Home Medications   Current Outpatient Rx  Name  Route  Sig  Dispense  Refill  . lamoTRIgine (LAMICTAL) 200 MG tablet   Oral   Take 300 mg by mouth daily.         . metoCLOPramide (REGLAN) 10 MG tablet   Oral   Take 10 mg by mouth 4 (four) times daily.         . metoCLOPramide (REGLAN) 10 MG tablet   Oral   Take 1 tablet (10 mg total) by mouth every 6 (six) hours as needed (nausea/headache).   6 tablet   0   . norethindrone-ethinyl estradiol-iron (MICROGESTIN FE,GILDESS FE,LOESTRIN FE) 1.5-30 MG-MCG tablet   Oral   Take 1 tablet by mouth daily.         . promethazine (PHENERGAN) 25 MG tablet   Oral   Take 25 mg by mouth every 6 (six) hours as needed.         . propantheline (PROBANTHINE) 15 MG tablet   Oral   Take 10 mg by mouth 3 (three) times daily with meals.  BP 110/70  Pulse 85  Temp(Src) 98.5 F (36.9 C) (Oral)  Resp 17  Ht 5\' 2"  (1.575 m)  Wt 120 lb (54.432 kg)  BMI 21.94 kg/m2  SpO2 99%  LMP 12/16/2013 Physical Exam Gen: NAD. Nontoxic in appearance. HEENT: AT. [place]. Bilateral TM visualized and normal in appearance. Bilateral eyes without injections or icterus. MMM. Bilateral nares without erythema. Throat without erythema or exudates.  CV: RRR, no murmurs, clicks, gallops or rubs. Chest: CTAB, no wheeze or crackles Abd: Soft. NTND. BS present. No Masses palpated.  Ext: No erythema. No edema.  Skin: No rashes, purpura or petechiae.  Neuro: Normal gait. PERLA. EOMi. Alert. Cranial nerves II through XII intact. Sensation intact. Psych: Normal affect, mood, and demeanor  ED Course  Procedures  (including critical care time) Labs Review Labs Reviewed - No data to display Imaging Review No results found.   EKG Interpretation None      MDM   Final diagnoses:  Migraine   Patient presented to ED with migraine headache affecting the right side of her head. Patient appeared nontoxic and in no acute distress. Peripheral IV started. Patient was given in migraine cocktail of Decadron, Reglan and Benadryl with improvement in symptoms. She was stable for discharge. Encouraged to followup with her primary care physician.    Ann LeatherwoodRenee A Claudetta Sallie, DO 01/12/14 1039

## 2014-01-12 NOTE — ED Notes (Signed)
MD at bedside. 

## 2014-02-03 ENCOUNTER — Emergency Department (HOSPITAL_BASED_OUTPATIENT_CLINIC_OR_DEPARTMENT_OTHER): Payer: Medicaid Other

## 2014-02-03 ENCOUNTER — Encounter (HOSPITAL_BASED_OUTPATIENT_CLINIC_OR_DEPARTMENT_OTHER): Payer: Self-pay | Admitting: Emergency Medicine

## 2014-02-03 ENCOUNTER — Emergency Department (HOSPITAL_BASED_OUTPATIENT_CLINIC_OR_DEPARTMENT_OTHER)
Admission: EM | Admit: 2014-02-03 | Discharge: 2014-02-03 | Disposition: A | Discharge status: Home / Self Care | Payer: Medicaid Other | Attending: Emergency Medicine | Admitting: Emergency Medicine

## 2014-02-03 DIAGNOSIS — N83209 Unspecified ovarian cyst, unspecified side: Secondary | ICD-10-CM | POA: Insufficient documentation

## 2014-02-03 DIAGNOSIS — Z3202 Encounter for pregnancy test, result negative: Secondary | ICD-10-CM | POA: Insufficient documentation

## 2014-02-03 DIAGNOSIS — Q043 Other reduction deformities of brain: Secondary | ICD-10-CM | POA: Insufficient documentation

## 2014-02-03 DIAGNOSIS — G43909 Migraine, unspecified, not intractable, without status migrainosus: Secondary | ICD-10-CM | POA: Insufficient documentation

## 2014-02-03 DIAGNOSIS — G40909 Epilepsy, unspecified, not intractable, without status epilepticus: Secondary | ICD-10-CM | POA: Insufficient documentation

## 2014-02-03 DIAGNOSIS — Z79899 Other long term (current) drug therapy: Secondary | ICD-10-CM | POA: Insufficient documentation

## 2014-02-03 LAB — CBC WITH DIFFERENTIAL/PLATELET
BASOS PCT: 0 % (ref 0–1)
Basophils Absolute: 0 10*3/uL (ref 0.0–0.1)
EOS PCT: 2 % (ref 0–5)
Eosinophils Absolute: 0.1 10*3/uL (ref 0.0–0.7)
HEMATOCRIT: 39.8 % (ref 36.0–46.0)
HEMOGLOBIN: 13.6 g/dL (ref 12.0–15.0)
LYMPHS ABS: 1.9 10*3/uL (ref 0.7–4.0)
Lymphocytes Relative: 27 % (ref 12–46)
MCH: 28.9 pg (ref 26.0–34.0)
MCHC: 34.2 g/dL (ref 30.0–36.0)
MCV: 84.5 fL (ref 78.0–100.0)
MONOS PCT: 9 % (ref 3–12)
Monocytes Absolute: 0.6 10*3/uL (ref 0.1–1.0)
Neutro Abs: 4.5 10*3/uL (ref 1.7–7.7)
Neutrophils Relative %: 63 % (ref 43–77)
Platelets: 203 10*3/uL (ref 150–400)
RBC: 4.71 MIL/uL (ref 3.87–5.11)
RDW: 12.4 % (ref 11.5–15.5)
WBC: 7.2 10*3/uL (ref 4.0–10.5)

## 2014-02-03 LAB — COMPREHENSIVE METABOLIC PANEL
ALK PHOS: 71 U/L (ref 39–117)
ALT: 20 U/L (ref 0–35)
AST: 21 U/L (ref 0–37)
Albumin: 4.8 g/dL (ref 3.5–5.2)
BUN: 12 mg/dL (ref 6–23)
CO2: 23 mEq/L (ref 19–32)
Calcium: 10.4 mg/dL (ref 8.4–10.5)
Chloride: 102 mEq/L (ref 96–112)
Creatinine, Ser: 0.7 mg/dL (ref 0.50–1.10)
GFR calc non Af Amer: >90 mL/min (ref >90)
GLUCOSE: 67 mg/dL — AB (ref 70–99)
POTASSIUM: 4 meq/L (ref 3.7–5.3)
Sodium: 141 mEq/L (ref 137–147)
Total Bilirubin: 0.4 mg/dL (ref 0.3–1.2)
Total Protein: 8 g/dL (ref 6.0–8.3)

## 2014-02-03 LAB — URINALYSIS, ROUTINE W REFLEX MICROSCOPIC
BILIRUBIN URINE: NEGATIVE
GLUCOSE, UA: NEGATIVE mg/dL
Hgb urine dipstick: NEGATIVE
KETONES UR: NEGATIVE mg/dL
Nitrite: NEGATIVE
PH: 5.5 (ref 5.0–8.0)
Protein, ur: NEGATIVE mg/dL
Specific Gravity, Urine: 1.019 (ref 1.005–1.030)
Urobilinogen, UA: 0.2 mg/dL (ref 0.0–1.0)

## 2014-02-03 LAB — URINE MICROSCOPIC-ADD ON

## 2014-02-03 LAB — LIPASE, BLOOD: Lipase: 32 U/L (ref 11–59)

## 2014-02-03 LAB — PREGNANCY, URINE: Preg Test, Ur: NEGATIVE

## 2014-02-03 MED ORDER — ONDANSETRON HCL 4 MG/2ML IJ SOLN
4.0000 mg | Freq: Once | INTRAMUSCULAR | Status: AC
  Administered 2014-02-03: 4 mg via INTRAVENOUS
  Filled 2014-02-03: qty 2

## 2014-02-03 MED ORDER — HYDROMORPHONE HCL PF 1 MG/ML IJ SOLN
1.0000 mg | Freq: Once | INTRAMUSCULAR | Status: AC
  Administered 2014-02-03: 1 mg via INTRAVENOUS
  Filled 2014-02-03: qty 1

## 2014-02-03 MED ORDER — IOHEXOL 300 MG/ML  SOLN
100.0000 mL | Freq: Once | INTRAMUSCULAR | Status: AC | PRN
  Administered 2014-02-03: 100 mL via INTRAVENOUS

## 2014-02-03 MED ORDER — TRAMADOL HCL 50 MG PO TABS: 50.0000 mg | ORAL_TABLET | Freq: Four times a day (QID) | ORAL | Status: DC | PRN

## 2014-02-03 MED ORDER — SODIUM CHLORIDE 0.9 % IV BOLUS (SEPSIS)
1000.0000 mL | Freq: Once | INTRAVENOUS | Status: AC
  Administered 2014-02-03: 1000 mL via INTRAVENOUS

## 2014-02-03 MED ORDER — IOHEXOL 300 MG/ML  SOLN
50.0000 mL | Freq: Once | INTRAMUSCULAR | Status: AC | PRN
  Administered 2014-02-03: 50 mL via ORAL

## 2014-02-03 NOTE — ED Notes (Signed)
Patient transported to X-ray 

## 2014-02-03 NOTE — Discharge Instructions (Signed)

## 2014-02-03 NOTE — ED Notes (Signed)
Pt returned to ED room 1. 

## 2014-02-03 NOTE — ED Notes (Signed)
Pt states she doesn't want the rest of the medication "I don't like the way it makes me feel".  EDP informed and medication wasted.

## 2014-02-03 NOTE — ED Provider Notes (Signed)
CSN: [id number]     Arrival date & time 02/03/14  4098 History   First MD Initiated Contact with Patient 02/03/14 1020     Chief Complaint  Patient presents with  . Abdominal Pain     (Consider location/radiation/quality/duration/timing/severity/associated sxs/prior Treatment) Patient is a 23 y.o. female presenting with abdominal pain.  Abdominal Pain  Pt with history of frequent migraines presents today with complaints of sudden onset severe diffuse cramping abdominal pain several hours ago. No associated vomiting, diarrhea or constipation. No dysuria, hematuria. Vaginal bleeding or discharge. Pain is worse with stretching out on bed and walking but not with riding in the car.   Past Medical History  Diagnosis Date  . Migraine   . 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 Polymicrogyria    Past Surgical History  Procedure Laterality Date  . Foot surgery    . Breast surgery     No family history on file. History  Substance Use Topics  . Smoking status: Never Smoker   . Smokeless tobacco: Never Used  . Alcohol Use: No     Comment: occasionally   OB History   Grav Para Term Preterm Abortions TAB SAB Ect Mult Living                 Review of Systems  Gastrointestinal: Positive for abdominal pain.    All other systems reviewed and are negative except as noted in HPI.    Allergies  Codeine; Oxycontin; and Rizatriptan  Home Medications   Current Outpatient Rx  Name  Route  Sig  Dispense  Refill  . lamoTRIgine (LAMICTAL) 200 MG tablet   Oral   Take 300 mg by mouth daily.         . metoCLOPramide (REGLAN) 10 MG tablet   Oral   Take 10 mg by mouth 4 (four) times daily.         . metoCLOPramide (REGLAN) 10 MG tablet   Oral   Take 1 tablet (10 mg total) by mouth every 6 (six) hours as needed (nausea/headache).   6 tablet   0   . norethindrone-ethinyl estradiol-iron (MICROGESTIN FE,GILDESS FE,LOESTRIN FE) 1.5-30 MG-MCG tablet   Oral   Take 1 tablet by  mouth daily.         . promethazine (PHENERGAN) 25 MG tablet   Oral   Take 25 mg by mouth every 6 (six) hours as needed.         . propantheline (PROBANTHINE) 15 MG tablet   Oral   Take 10 mg by mouth 3 (three) times daily with meals.          BP 115/67  Pulse 86  Temp(Src) 98.2 F (36.8 C) (Oral)  Resp 16  Ht 5\' 2"  (1.575 m)  Wt 120 lb (54.432 kg)  BMI 21.94 kg/m2  SpO2 99%  LMP 01/13/2014 Physical Exam  Nursing note and vitals reviewed. Constitutional: She is oriented to person, place, and time. She appears well-developed and well-nourished.  HENT:  Head: Normocephalic and atraumatic.  Eyes: EOM are normal. Pupils are equal, round, and reactive to light.  Neck: Normal range of motion. Neck supple.  Cardiovascular: Normal rate, normal heart sounds and intact distal pulses.   Pulmonary/Chest: Effort normal and breath sounds normal.  Abdominal: Bowel sounds are normal. She exhibits no distension. There is tenderness (diffuse, no focal tenderness). There is guarding (diffuse). There is no rebound.  Musculoskeletal: Normal range of motion. She exhibits no  edema and no tenderness.  Neurological: She is alert and oriented to person, place, and time. She has normal strength. No cranial nerve deficit or sensory deficit.  Skin: Skin is warm and dry. No rash noted.  Psychiatric: She has a normal mood and affect.    ED Course  Procedures (including critical care time) Labs Review Labs Reviewed  URINALYSIS, ROUTINE W REFLEX MICROSCOPIC - Abnormal; Notable for the following:    APPearance CLOUDY (*)    Leukocytes, UA SMALL (*)    All other components within normal limits  COMPREHENSIVE METABOLIC PANEL - Abnormal; Notable for the following:    Glucose, Bld 67 (*)    All other components within normal limits  URINE MICROSCOPIC-ADD ON - Abnormal; Notable for the following:    Squamous Epithelial / LPF FEW (*)    Bacteria, UA MANY (*)    All other components within normal  limits  PREGNANCY, URINE  CBC WITH DIFFERENTIAL  LIPASE, BLOOD   Imaging Review Ct Abdomen Pelvis W Contrast  02/03/2014   CLINICAL DATA:  Diffuse abdominal tenderness  EXAM: CT ABDOMEN AND PELVIS WITH CONTRAST  TECHNIQUE: Multidetector CT imaging of the abdomen and pelvis was performed using the standard protocol following bolus administration of intravenous contrast. Oral contrast was also administered.  CONTRAST:  OMNIPAQUE IOHEXOL 300 MG/ML  SOLN  COMPARISON:  Abdomen series February 03, 2014; CT abdomen and pelvis July 05, 2007  FINDINGS: Lung bases are clear.  No focal liver lesions are identified. There is no biliary duct dilatation. Gallbladder wall is not appreciably thickened.  Spleen, pancreas, and adrenals appear normal. Kidneys bilaterally show no mass or hydronephrosis on either side. There is an extrarenal pelvis on the right, an anatomic variant. There is no appreciable renal or ureteral calculus on either side.  In the pelvis, the urinary bladder is midline with normal wall thickness. There is enhancement of the wall of a cystic area arising from the left ovary measuring 2.1 x 1.6 cm. This finding most likely is indicative of a recently ruptured cyst. Note that there is a small amount of free fluid in the cul-de-sac region. There is no other pelvic mass. Appendix appears normal.  There is borderline colonic distention. There is no small bowel distention. There is no appreciable bowel obstruction. No free air or portal venous air.  There is no adenopathy or abscess in the abdomen or pelvis. Aorta is non There are no blastic or lytic bone lesions. Aneurysmal.  IMPRESSION: Mild colonic ileus.  No bowel obstruction.  Suspect recent ovarian cyst rupture on the left. Small amount of free fluid in cul-de-sac.  Appendix appears normal. No renal or ureteral calculus. No hydronephrosis. No abscess.   Electronically Signed   By: Bretta Bang M.D.   On: 02/03/2014 13:04   Dg Abd Acute  W/chest  02/03/2014   CLINICAL DATA:  Diffuse abdominal pain.  EXAM: ACUTE ABDOMEN SERIES (ABDOMEN 2 VIEW & CHEST 1 VIEW)  COMPARISON:  Two-view chest 09/13/2013  FINDINGS: The heart size is normal. Mild dependent atelectasis is present bilaterally.  Supine and upright views the abdomen demonstrate mild gaseous distention of large and small bowel without clear obstruction. There is stool at the rectosigmoid colon and within the ascending colon. No free air is present. The axial skeleton is within normal limits.  IMPRESSION: 1. Mild gaseous distention of the large and small bowel without clear obstruction. This likely represents an ileus. 2. Mild bibasilar atelectasis.   Electronically Signed  By: Gennette Pachris  Mattern M.D.   On: 02/03/2014 11:14     EKG Interpretation None      MDM   Final diagnoses:  Ruptured ovarian cyst    CT results reviewed, shows likely ruptured ovarian cyst which would account for the patient's symptoms. Doubt PID given lack of fever, leukocytosis or vaginal discharge.     Charles B. Bernette MayersSheldon, MD 02/03/14 [phone]

## 2014-02-03 NOTE — ED Notes (Signed)
Abdominal pain that started this am unrelieved after taking Aleve.

## 2014-03-10 ENCOUNTER — Other Ambulatory Visit: Payer: Self-pay | Admitting: Family Medicine

## 2014-03-10 DIAGNOSIS — R102 Pelvic and perineal pain: Secondary | ICD-10-CM

## 2014-03-14 ENCOUNTER — Ambulatory Visit
Admission: RE | Admit: 2014-03-14 | Discharge: 2014-03-14 | Disposition: A | Discharge status: Home / Self Care | Payer: Medicaid Other | Source: Ambulatory Visit | Attending: Family Medicine | Admitting: Family Medicine

## 2014-03-14 DIAGNOSIS — R102 Pelvic and perineal pain: Secondary | ICD-10-CM

## 2014-06-07 ENCOUNTER — Ambulatory Visit
Admission: RE | Admit: 2014-06-07 | Discharge: 2014-06-07 | Disposition: A | Discharge status: Home / Self Care | Payer: Medicaid Other | Source: Ambulatory Visit | Attending: Family Medicine | Admitting: Family Medicine

## 2014-06-07 ENCOUNTER — Other Ambulatory Visit: Payer: Self-pay | Admitting: Family Medicine

## 2014-06-07 DIAGNOSIS — M79671 Pain in right foot: Secondary | ICD-10-CM

## 2014-06-19 ENCOUNTER — Other Ambulatory Visit: Payer: Self-pay | Admitting: Family Medicine

## 2014-06-19 DIAGNOSIS — R52 Pain, unspecified: Secondary | ICD-10-CM

## 2014-06-19 DIAGNOSIS — R609 Edema, unspecified: Secondary | ICD-10-CM

## 2014-06-22 ENCOUNTER — Ambulatory Visit
Admission: RE | Admit: 2014-06-22 | Discharge: 2014-06-22 | Disposition: A | Discharge status: Home / Self Care | Payer: Medicaid Other | Source: Ambulatory Visit | Attending: Family Medicine | Admitting: Family Medicine

## 2014-06-22 DIAGNOSIS — R609 Edema, unspecified: Secondary | ICD-10-CM

## 2014-06-22 DIAGNOSIS — R52 Pain, unspecified: Secondary | ICD-10-CM

## 2014-12-13 ENCOUNTER — Other Ambulatory Visit (HOSPITAL_COMMUNITY)
Admission: RE | Admit: 2014-12-13 | Discharge: 2014-12-13 | Disposition: A | Discharge status: Home / Self Care | Payer: Medicaid Other | Source: Ambulatory Visit | Attending: Family Medicine | Admitting: Family Medicine

## 2014-12-13 ENCOUNTER — Other Ambulatory Visit: Payer: Self-pay | Admitting: Family Medicine

## 2014-12-13 DIAGNOSIS — Z113 Encounter for screening for infections with a predominantly sexual mode of transmission: Secondary | ICD-10-CM | POA: Diagnosis present

## 2014-12-13 DIAGNOSIS — Z124 Encounter for screening for malignant neoplasm of cervix: Secondary | ICD-10-CM | POA: Diagnosis present

## 2014-12-14 LAB — CYTOLOGY - PAP

## 2016-05-09 IMAGING — US US PELVIS COMPLETE
1 series · 14 of 25 positions shown · non-contrast
Comparison: CT of the abdomen and pelvis on 02/03/2014

CLINICAL DATA: Persistent general pelvic pain since ruptured
ovarian cyst. History possible ruptured ovarian cyst based on CT in
[place].



[Series 1: us pelvis complete · 0.29mm/px · 14 of 47 slices shown]
[im 1/47]
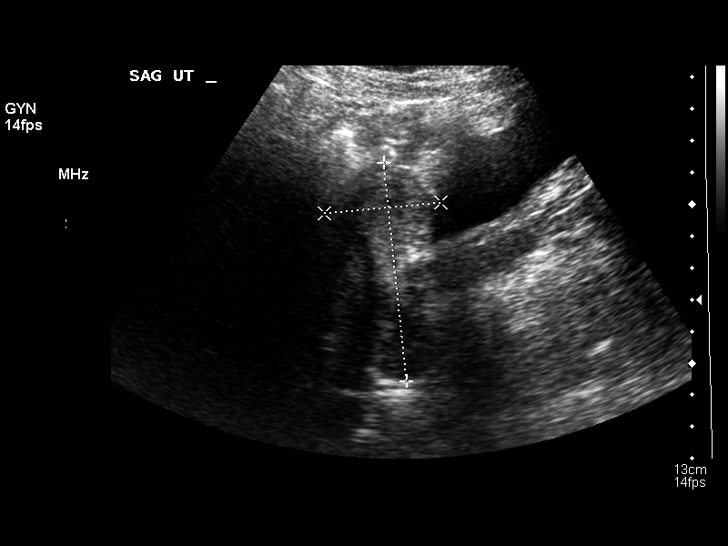
[im 4/47]
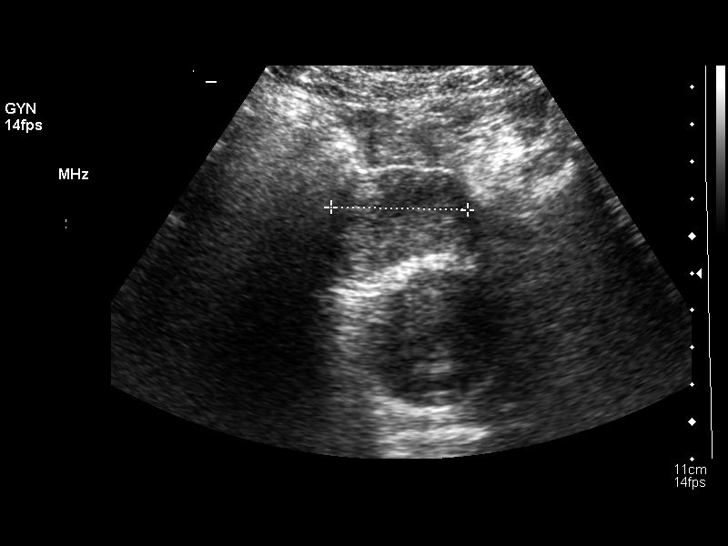
[im 8/47]
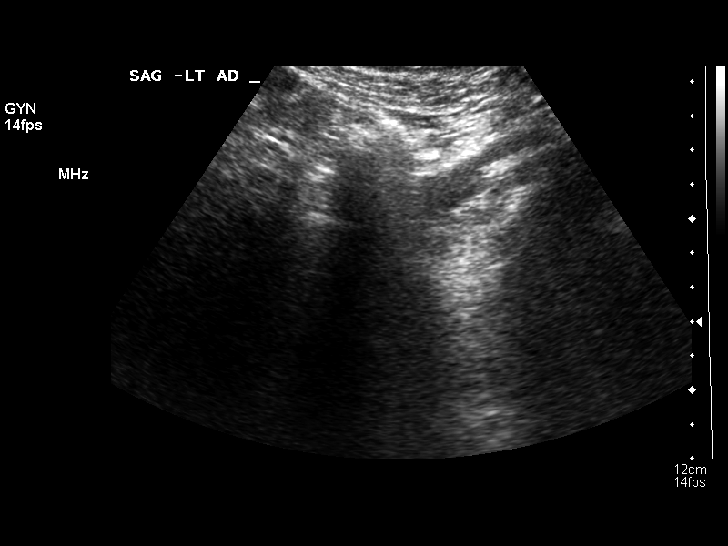
[im 12/47]
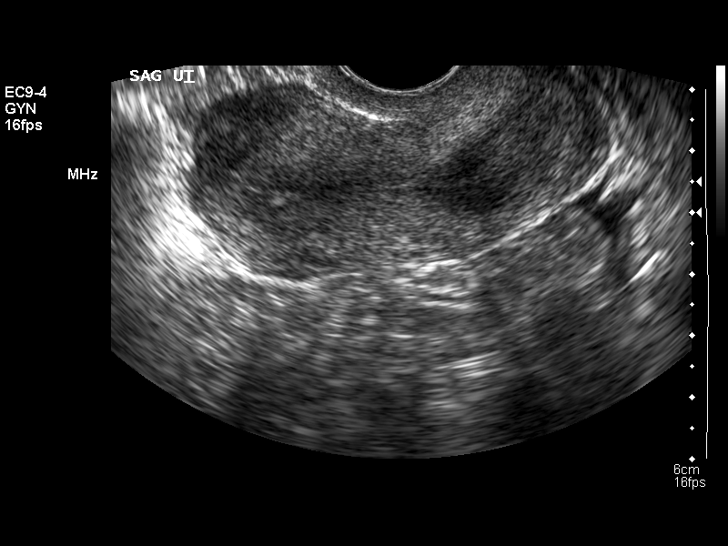
[im 16/47]
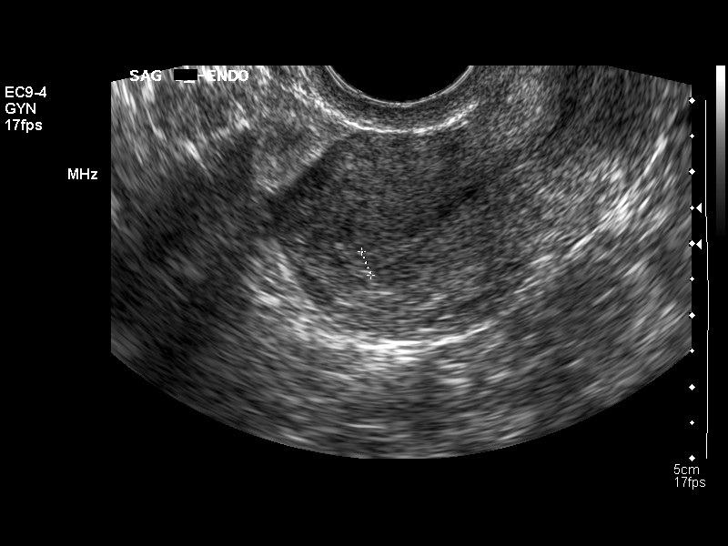
[im 18/47]
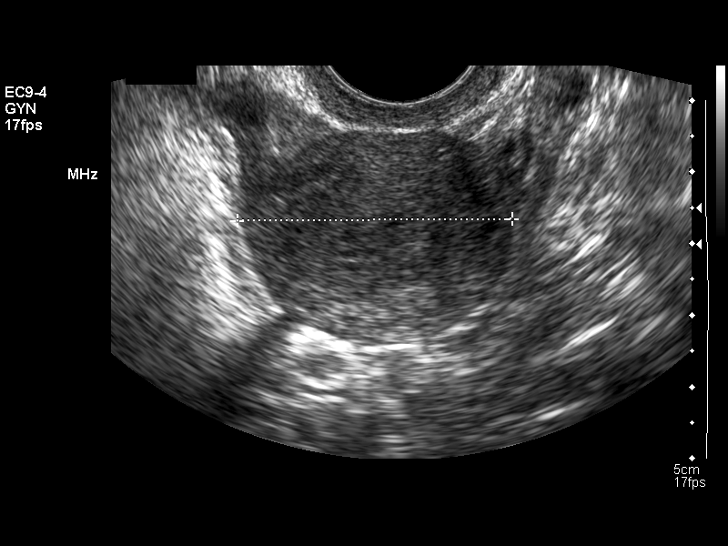
[im 22/47]
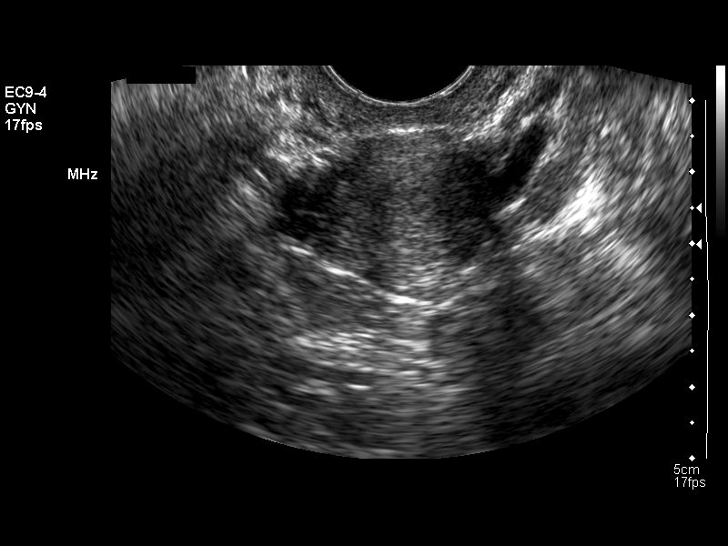
[im 25/47]
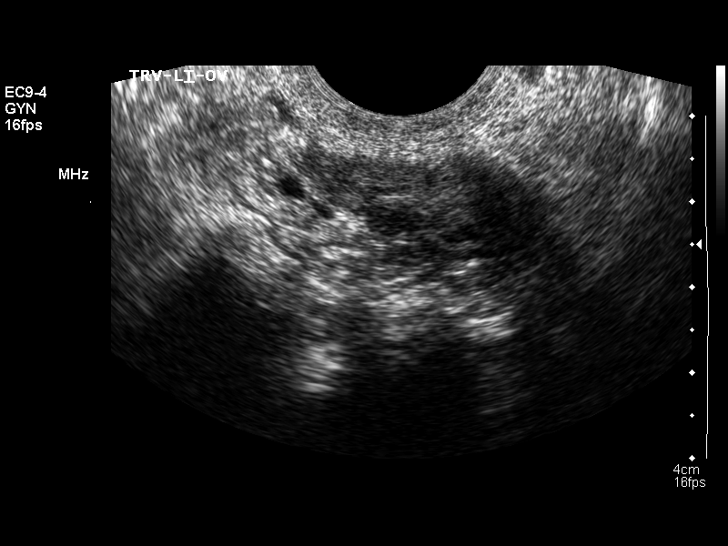
[im 29/47]
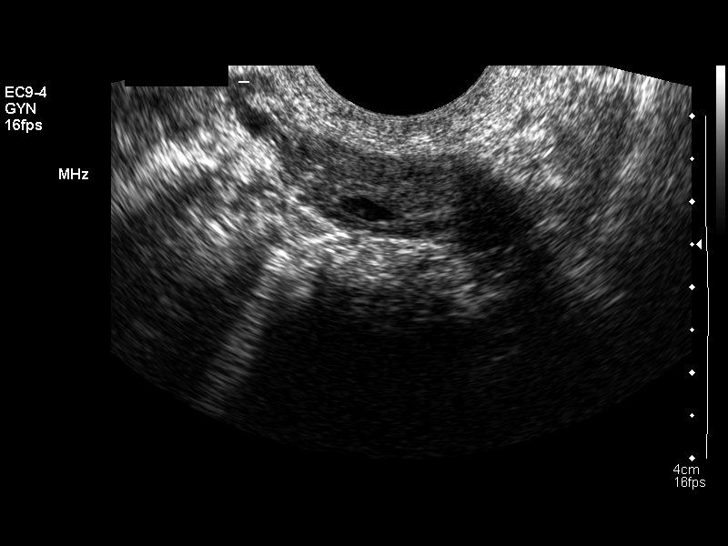
[im 31/47]
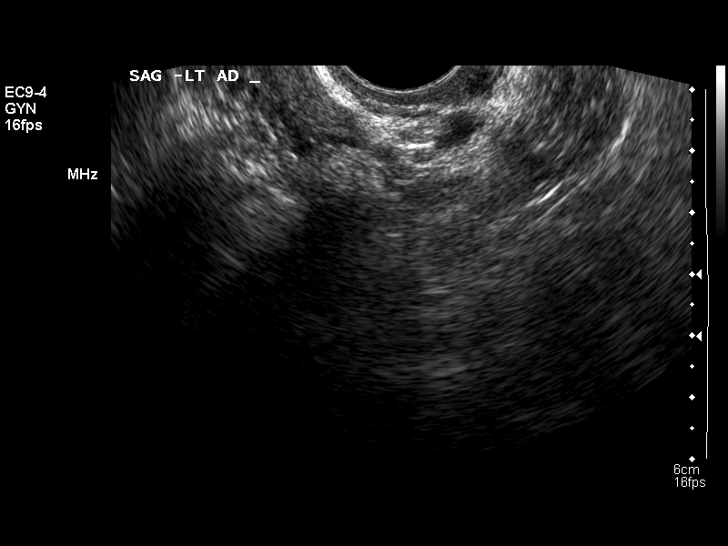
[im 35/47]
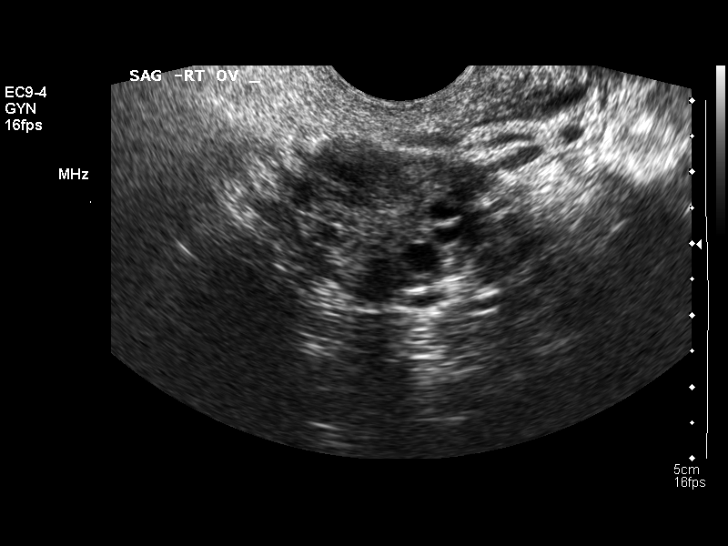
[im 39/47]
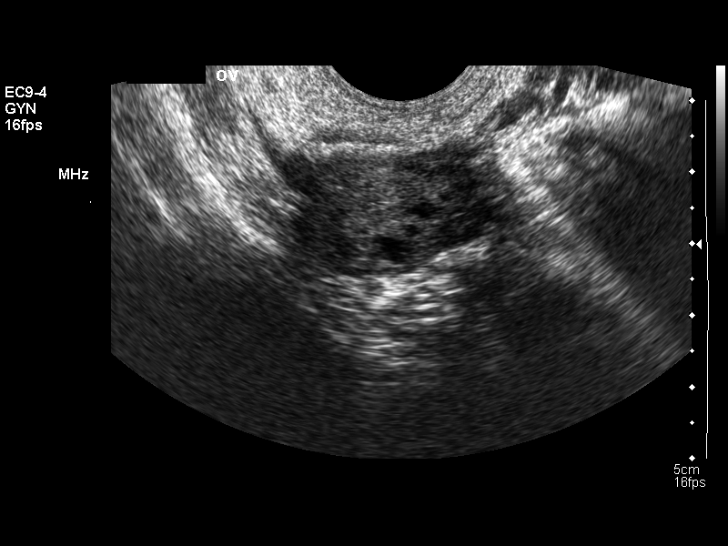
[im 43/47]
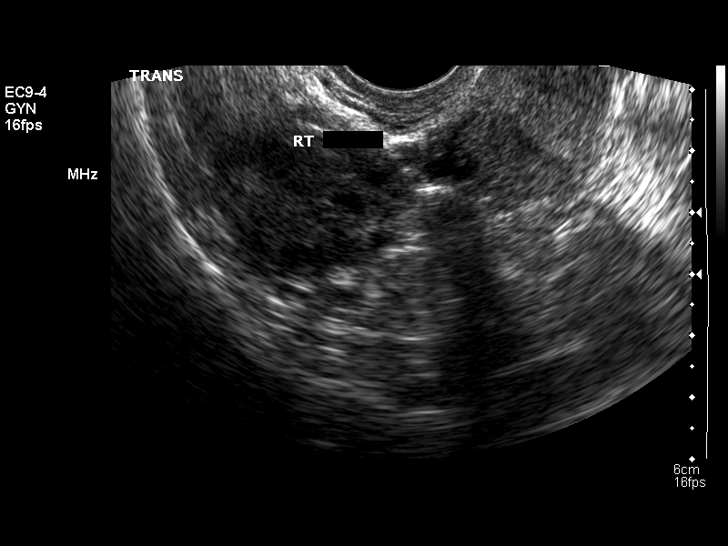
[im 47/47]
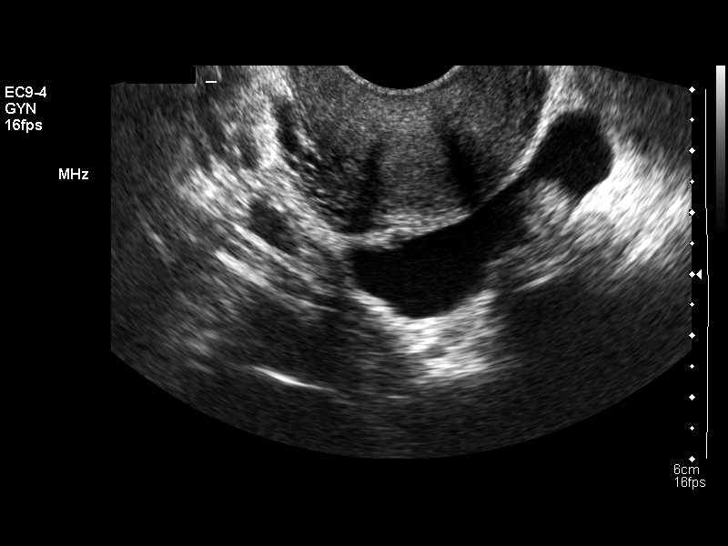

[14 of 25 positions shown; findings below may reference images not displayed]

FINDINGS: Uterus

Measurements: 6.9 x 3.2 x 3.8 cm. No fibroids or other mass
visualized.

Endometrium

Thickness: 3.5 mm.  No focal abnormality visualized.

Right ovary

Measurements: 2.9 x 2.1 x 2.8 cm. Well evaluated on transvaginal
portion of the exam. Small follicles are present.

Left ovary

Measurements: 2.9 x 1.4 x 2.6 cm. Well evaluated on the transvaginal
portion of the exam. Small follicles are present.

Other findings

Trace free pelvic fluid.
IMPRESSION: Normal pelvic ultrasound.

## 2016-08-17 IMAGING — US US EXTREM LOW VENOUS*R*
1 series · 13 of 24 positions shown · non-contrast
Comparison: None.

CLINICAL DATA: Right leg pain and edema.  Evaluate for DVT.



[Series 1: us extrem low venous*right* · 13 of 32 slices shown]
[im 1/32]
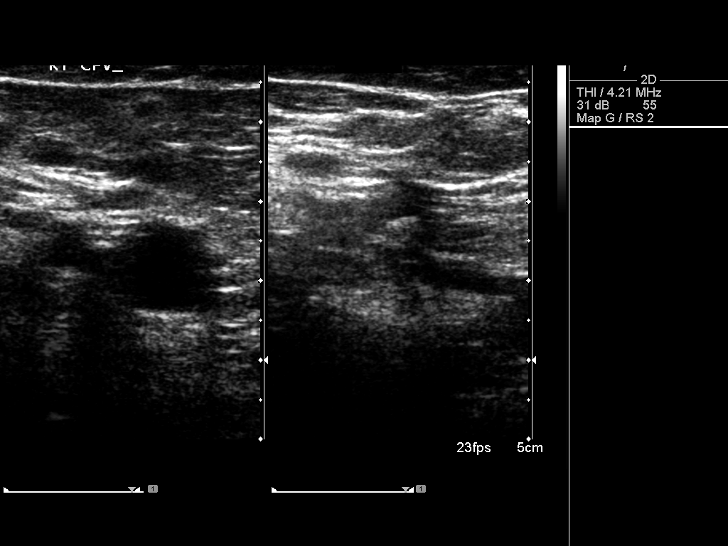
[im 3/32]
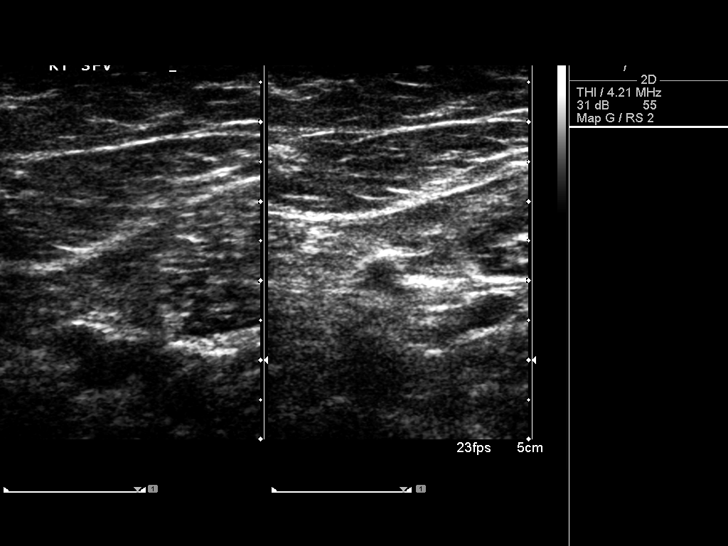
[im 6/32]
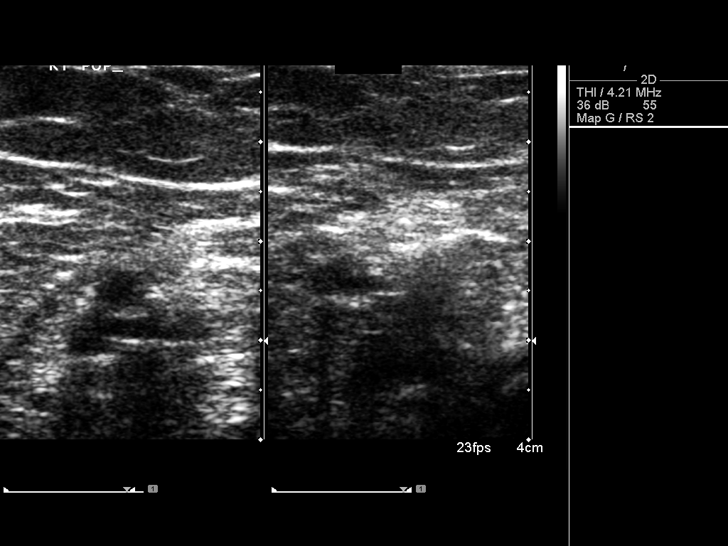
[im 9/32]
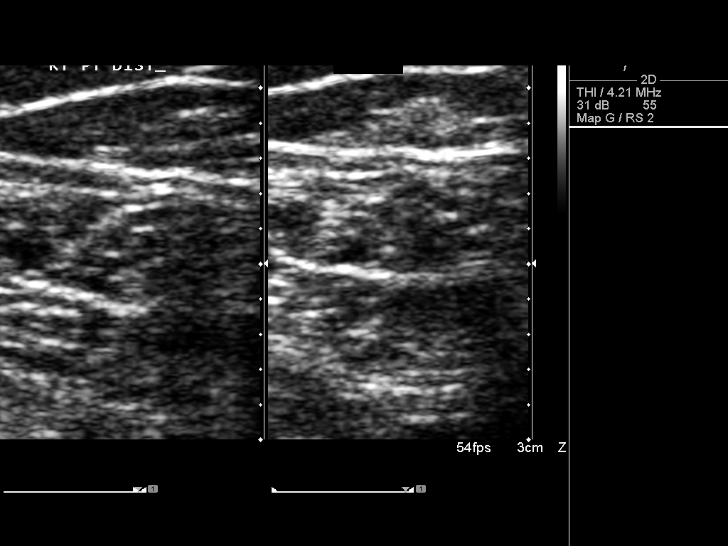
[im 11/32]
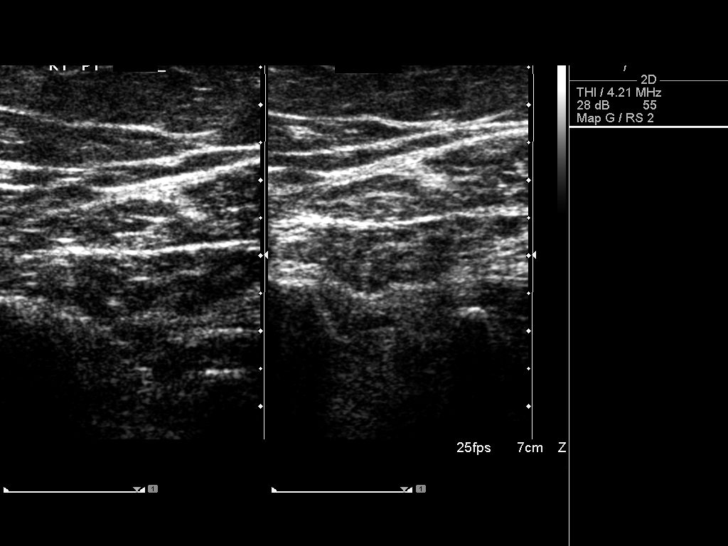
[im 14/32]
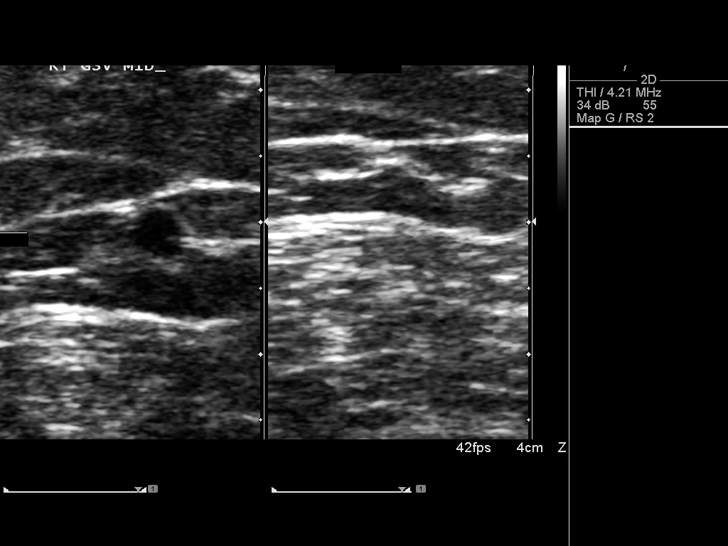
[im 17/32]
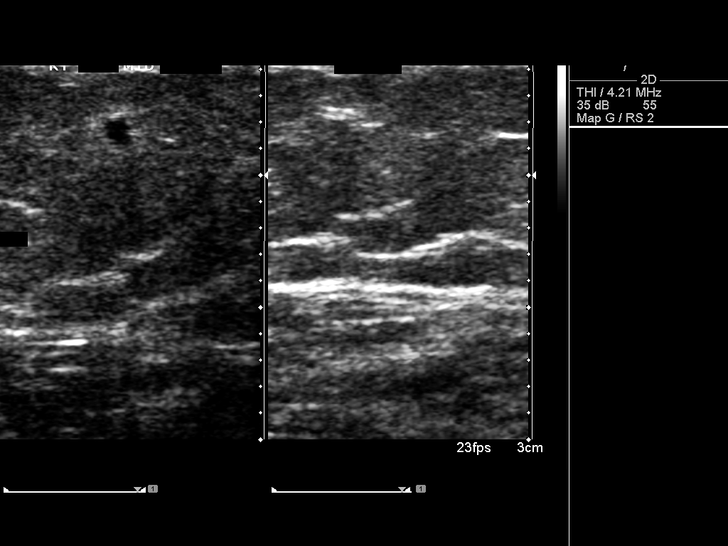
[im 18/32]
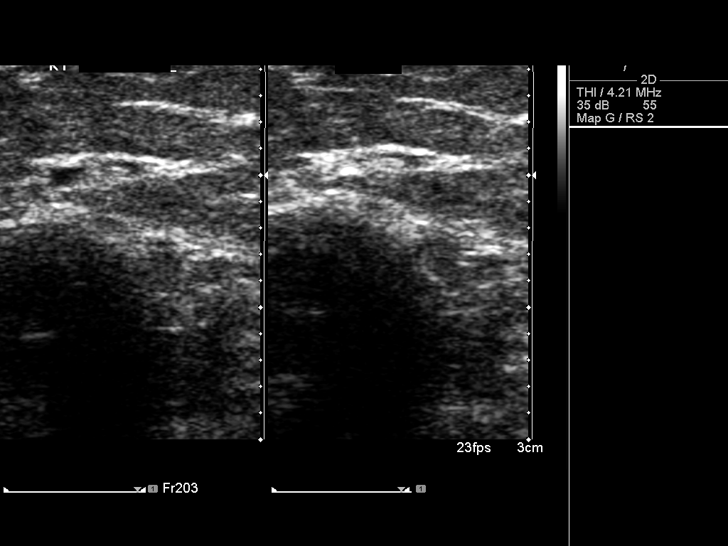
[im 21/32]
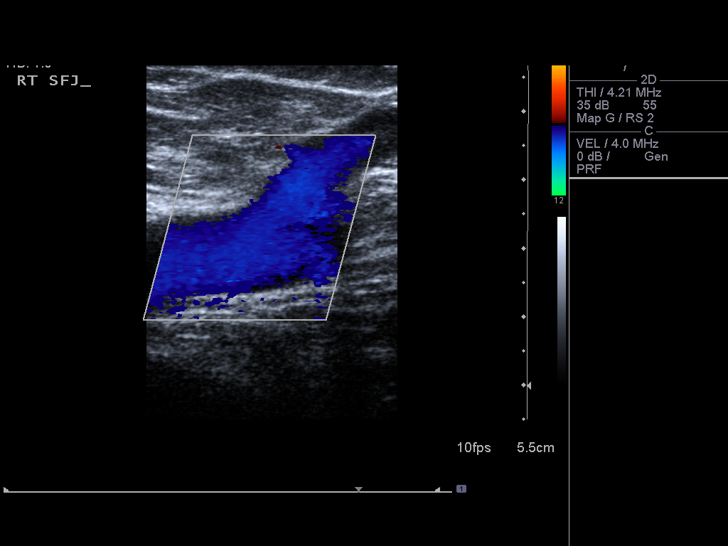
[im 23/32]
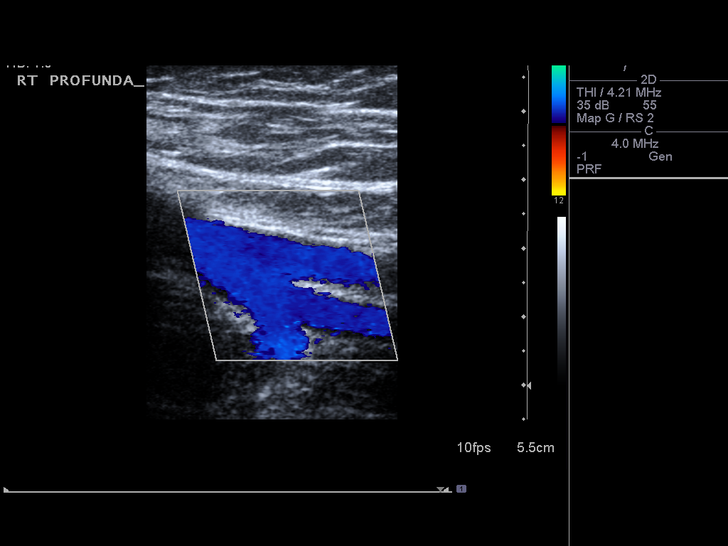
[im 26/32]
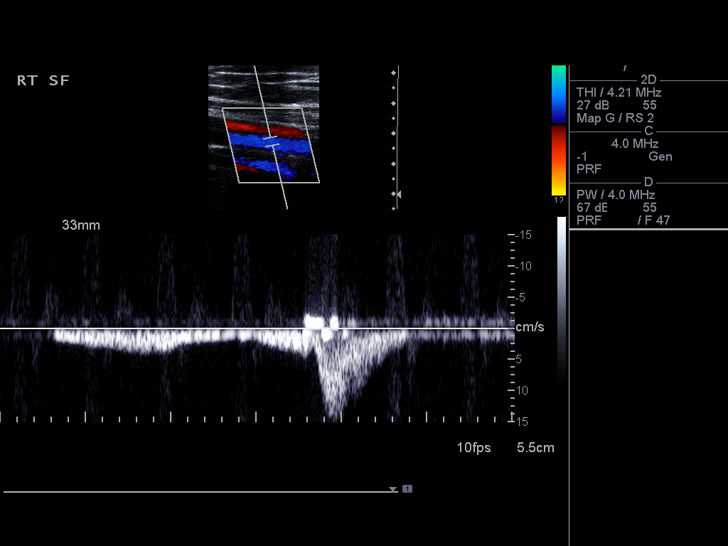
[im 29/32]
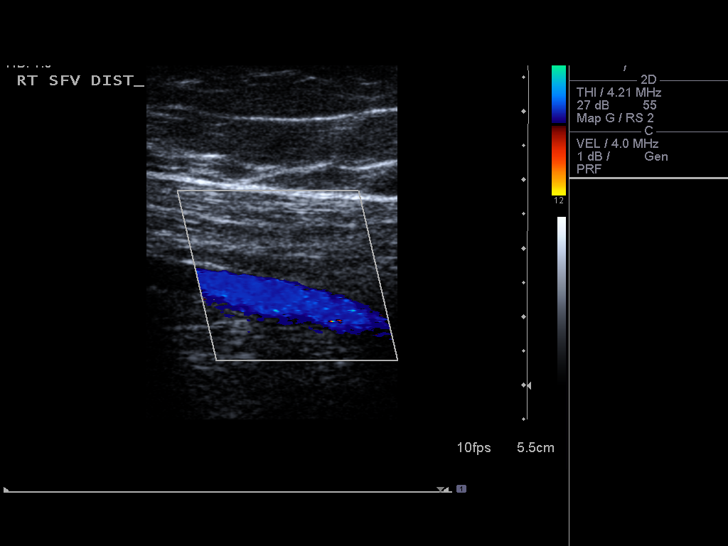
[im 32/32]
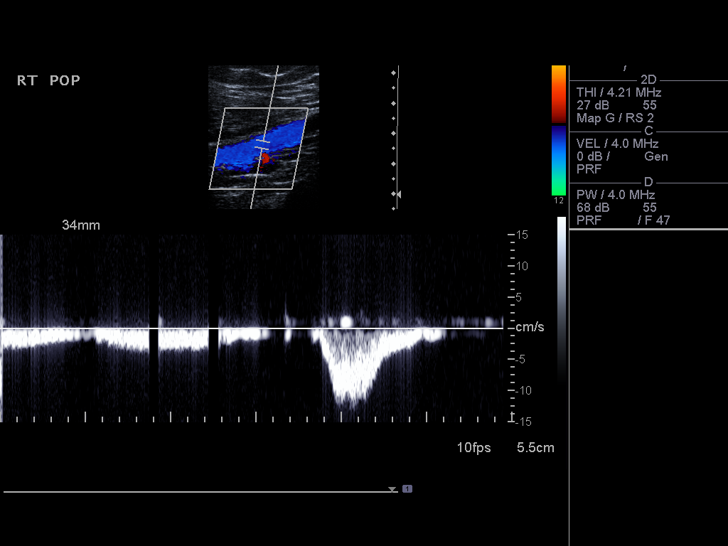

[13 of 24 positions shown; findings below may reference images not displayed]

FINDINGS: Common Femoral Vein: No evidence of thrombus. Normal
compressibility, respiratory phasicity and response to augmentation.

Saphenofemoral Junction: No evidence of thrombus. Normal
compressibility and flow on color Doppler imaging.

Profunda Femoral Vein: No evidence of thrombus. Normal
compressibility and flow on color Doppler imaging.

Femoral Vein: No evidence of thrombus. Normal compressibility,
respiratory phasicity and response to augmentation.

Popliteal Vein: No evidence of thrombus. Normal compressibility,
respiratory phasicity and response to augmentation.

Calf Veins: No evidence of thrombus. Normal compressibility and flow
on color Doppler imaging.

Superficial Great Saphenous Vein: No evidence of thrombus. Normal
compressibility and flow on color Doppler imaging.

Venous Reflux:  None.

Other Findings:  None.
IMPRESSION: No evidence of DVT within the right lower extremity.

## 2016-10-14 ENCOUNTER — Ambulatory Visit (INDEPENDENT_AMBULATORY_CARE_PROVIDER_SITE_OTHER): Payer: Managed Care, Other (non HMO) | Admitting: Sports Medicine

## 2016-10-14 ENCOUNTER — Encounter: Payer: Self-pay | Admitting: Sports Medicine

## 2016-10-14 DIAGNOSIS — Q72899 Other reduction defects of unspecified lower limb: Secondary | ICD-10-CM | POA: Diagnosis not present

## 2016-10-14 DIAGNOSIS — Q046 Congenital cerebral cysts: Secondary | ICD-10-CM

## 2016-10-14 DIAGNOSIS — M62838 Other muscle spasm: Secondary | ICD-10-CM

## 2016-10-14 DIAGNOSIS — R269 Unspecified abnormalities of gait and mobility: Secondary | ICD-10-CM | POA: Diagnosis not present

## 2016-10-14 MED ORDER — CYCLOBENZAPRINE HCL 10 MG PO TABS: 10.0000 mg | ORAL_TABLET | Freq: Every evening | ORAL | 2 refills | Status: DC | PRN

## 2016-10-14 NOTE — Assessment & Plan Note (Signed)
She does improve somewhat with a lift Trial with lift in shoes

## 2016-10-14 NOTE — Patient Instructions (Signed)
From your congenital issues with your RT foot and leg you have a 1 inch short left leg This is jamming the sacroiliac joints in your pelvis and low back  You cannot stand for longer than 2 hours at a time You need to use a lift to help correct the leg length difference  Use flexeril at night for muscle spasm See if this affects the headache frequency  A few exercises for neck and upper back Arm shake outs - 15 dumbell T stretches to each side Easy neck motion exercises  We need to try to get your head to a midline position Look in the mirror while standing Once we get the leg length corrected better this will be easier  Try a neck collar for rest during evenings for 1 to 2 hours and maybe for sleeping  Recheck in 2 months

## 2016-10-14 NOTE — Assessment & Plan Note (Signed)
Trial one xercises Trial on nightly flexeril Watch for drowsiness Work on postural position of head Easy neck motion

## 2016-10-14 NOTE — Progress Notes (Signed)
CC: RT upper back pain  Referred courtesy of Dr. 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 Possibly related to abnormal foot development on right requiring surgical correction Recently MRI suggestive microgyria Chronic sieizures controlled on Lamictal Frequent cluster type headaches  And uses prophylactic medication at early onset  Past HX Lumbar lordosis and pain Difficulty with gait and shorter RT leg Reconstructive surgery of RT foot  ROS Pain with certain neck motions Pain in low back with standing too long RT upperback pain will waken her from sleep Neck pillow helps sometimes  PExam Pleasant F in NAD BP 128/89   Ht 5\' 2"  (1.575 m)   Wt 128 lb (58.1 kg)   BMI 23.41 kg/m   Neck shows good flex/ ext Norm rotation but some pain on left rotation Lateral bend is painfulparticularly to left  Head position is 10 degrees to RT from midline Marked spasm of RT trapezius  Lumbar spins shows mild scoliosis Lack of normal lordosis  RT leg is 2.5 cms shorter Atrophy noted  RT ankle shows minimal dorsiflexion and plantar flexion Others motions limited  Gait is abnormal with left leg bent at 10 deg knee flexion and pelvis rotated forward Left leg with limited push off and this shifts trunk and head to RT

## 2016-12-16 ENCOUNTER — Ambulatory Visit (INDEPENDENT_AMBULATORY_CARE_PROVIDER_SITE_OTHER): Payer: Managed Care, Other (non HMO) | Admitting: Sports Medicine

## 2016-12-16 ENCOUNTER — Encounter: Payer: Self-pay | Admitting: Sports Medicine

## 2016-12-16 VITALS — BP 117/79 | HR 91 | Ht 62.0 in | Wt 128.0 lb

## 2016-12-16 DIAGNOSIS — M62838 Other muscle spasm: Secondary | ICD-10-CM | POA: Diagnosis not present

## 2016-12-16 DIAGNOSIS — Q72899 Other reduction defects of unspecified lower limb: Secondary | ICD-10-CM

## 2016-12-16 DIAGNOSIS — M549 Dorsalgia, unspecified: Secondary | ICD-10-CM | POA: Insufficient documentation

## 2016-12-16 DIAGNOSIS — M545 Low back pain, unspecified: Secondary | ICD-10-CM

## 2016-12-16 NOTE — Progress Notes (Signed)
  Weber CooksKimberly A Hood - 26 y.o. female MRN [id number]  Date of birth: [birth year]  SUBJECTIVE:   CC: Follow-up Back Pain  HPI: Patient is here for her 2 month follow-up. She states she feels her posture is much improved with all of the interventions that were given at last visit. She continues to follow the exercises for her neck and back. Wears the neck brace intermittently. Takes flexeril at night because it makes her drowsy. Was noted to have leg length discrepancy. Patient states the shoe lift in her right shoe has helped. She is no longer falling. Only continued concern is shoulder blade pain that is worse on R compared to L.   ROS per HPI. Additionally: no seizures, no fevers   HISTORY: Past Medical, Surgical, Social, and Family History Reviewed & Updated per EMR.   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 Patient to continue with lift in right shoe. This is improving her gait and posture. Catalog given for patient to be able to order more lifts prn.   2. Trapezius muscle spasm Continues to have intermittent spasm that causes upper back pain. Has been improving with improving gait and posture. Continue flexeril and neck ROM exercises. Continue neck brace to help with head position.  3. Bilateral low back pain without sciatica, unspecified chronicity Low back pain most likely from leg length discrepancy causing pelvic shift and subsequently lumbar pain. Has improved  with shoe lifts and improved posture. Patient wants to try back brace for posture; this may be helpful.   Follow-up in 3 months  Caryl AdaJazma Dezaria Methot, DO 12/16/2016, 2:07 PM PGY-3, [facility] I observed and examined the patient with the resident and agree with assessment and plan.  Note reviewed and modified by me. Enid BaasKarl Fields, MD

## 2017-02-17 ENCOUNTER — Other Ambulatory Visit: Payer: Self-pay | Admitting: Sports Medicine

## 2017-03-19 ENCOUNTER — Ambulatory Visit: Payer: Self-pay | Admitting: Sports Medicine

## 2017-04-16 ENCOUNTER — Ambulatory Visit: Payer: Self-pay | Admitting: Sports Medicine

## 2017-04-28 ENCOUNTER — Ambulatory Visit: Payer: Self-pay | Admitting: Sports Medicine

## 2018-05-25 ENCOUNTER — Telehealth: Payer: Self-pay | Admitting: Sports Medicine

## 2018-05-25 NOTE — Telephone Encounter (Signed)
She just wanted to get information on some 'lifts'.  She had received a packet/booklet from us that showed what kind to put in her shoe.  She needs the website address whenever you can, call her at [phone]
